# Patient Record
Sex: Female | Born: 1965 | Race: White | Hispanic: No | Marital: Single | Smoking: Current every day smoker
Health system: Southern US, Community
[De-identification: ages and names within clinical notes are randomized; demographics above are authoritative.]

## PROBLEM LIST (undated history)

## (undated) DIAGNOSIS — I82409 Acute embolism and thrombosis of unspecified deep veins of unspecified lower extremity: Secondary | ICD-10-CM

## (undated) DIAGNOSIS — Z86711 Personal history of pulmonary embolism: Secondary | ICD-10-CM

## (undated) DIAGNOSIS — M93272 Osteochondritis dissecans, left ankle and joints of left foot: Secondary | ICD-10-CM

## (undated) DIAGNOSIS — Z8719 Personal history of other diseases of the digestive system: Secondary | ICD-10-CM

## (undated) DIAGNOSIS — M19072 Primary osteoarthritis, left ankle and foot: Secondary | ICD-10-CM

## (undated) DIAGNOSIS — R000 Tachycardia, unspecified: Secondary | ICD-10-CM

## (undated) DIAGNOSIS — F419 Anxiety disorder, unspecified: Secondary | ICD-10-CM

## (undated) DIAGNOSIS — G5602 Carpal tunnel syndrome, left upper limb: Secondary | ICD-10-CM

## (undated) DIAGNOSIS — D689 Coagulation defect, unspecified: Secondary | ICD-10-CM

## (undated) DIAGNOSIS — Z860100 Personal history of colon polyps, unspecified: Secondary | ICD-10-CM

## (undated) DIAGNOSIS — I2699 Other pulmonary embolism without acute cor pulmonale: Secondary | ICD-10-CM

## (undated) DIAGNOSIS — F32A Depression, unspecified: Secondary | ICD-10-CM

## (undated) DIAGNOSIS — I4891 Unspecified atrial fibrillation: Secondary | ICD-10-CM

## (undated) DIAGNOSIS — Z7901 Long term (current) use of anticoagulants: Secondary | ICD-10-CM

## (undated) DIAGNOSIS — I10 Essential (primary) hypertension: Secondary | ICD-10-CM

## (undated) DIAGNOSIS — E785 Hyperlipidemia, unspecified: Secondary | ICD-10-CM

## (undated) DIAGNOSIS — K449 Diaphragmatic hernia without obstruction or gangrene: Secondary | ICD-10-CM

## (undated) DIAGNOSIS — F329 Major depressive disorder, single episode, unspecified: Secondary | ICD-10-CM

## (undated) DIAGNOSIS — E538 Deficiency of other specified B group vitamins: Secondary | ICD-10-CM

## (undated) DIAGNOSIS — M87072 Idiopathic aseptic necrosis of left ankle: Secondary | ICD-10-CM

## (undated) DIAGNOSIS — M797 Fibromyalgia: Secondary | ICD-10-CM

## (undated) HISTORY — DX: Primary osteoarthritis, left ankle and foot: M19.072

## (undated) HISTORY — DX: Idiopathic aseptic necrosis of left ankle: M87.072

## (undated) HISTORY — PX: ANKLE FUSION: SHX881

## (undated) HISTORY — PX: ABDOMINAL HYSTERECTOMY: SHX81

## (undated) HISTORY — DX: Personal history of pulmonary embolism: Z86.711

## (undated) HISTORY — DX: Personal history of colonic polyps: Z86.010

## (undated) HISTORY — DX: Personal history of other diseases of the digestive system: Z87.19

## (undated) HISTORY — DX: Diaphragmatic hernia without obstruction or gangrene: K44.9

## (undated) HISTORY — DX: Carpal tunnel syndrome, left upper limb: G56.02

## (undated) HISTORY — DX: Long term (current) use of anticoagulants: Z79.01

## (undated) HISTORY — DX: Osteochondritis dissecans, left ankle and joints of left foot: M93.272

## (undated) HISTORY — DX: Coagulation defect, unspecified: D68.9

## (undated) HISTORY — DX: Hyperlipidemia, unspecified: E78.5

## (undated) HISTORY — PX: SHOULDER ARTHROSCOPY: SHX128

## (undated) HISTORY — DX: Unspecified atrial fibrillation: I48.91

## (undated) HISTORY — DX: Deficiency of other specified B group vitamins: E53.8

## (undated) HISTORY — DX: Personal history of colon polyps, unspecified: Z86.0100

---

## 1999-02-26 ENCOUNTER — Emergency Department (HOSPITAL_COMMUNITY): Admission: EM | Admit: 1999-02-26 | Discharge: 1999-02-26 | Payer: Self-pay | Admitting: Emergency Medicine

## 2003-03-02 ENCOUNTER — Emergency Department (HOSPITAL_COMMUNITY): Admission: AD | Admit: 2003-03-02 | Discharge: 2003-03-03 | Payer: Self-pay | Admitting: Emergency Medicine

## 2003-03-09 ENCOUNTER — Other Ambulatory Visit: Admission: RE | Admit: 2003-03-09 | Discharge: 2003-03-09 | Payer: Self-pay | Admitting: Obstetrics and Gynecology

## 2003-03-15 ENCOUNTER — Observation Stay (HOSPITAL_COMMUNITY): Admission: RE | Admit: 2003-03-15 | Discharge: 2003-03-16 | Payer: Self-pay | Admitting: Obstetrics and Gynecology

## 2003-03-15 ENCOUNTER — Encounter (INDEPENDENT_AMBULATORY_CARE_PROVIDER_SITE_OTHER): Payer: Self-pay | Admitting: Specialist

## 2003-06-10 ENCOUNTER — Inpatient Hospital Stay (HOSPITAL_COMMUNITY): Admission: RE | Admit: 2003-06-10 | Discharge: 2003-06-11 | Payer: Self-pay | Admitting: Obstetrics and Gynecology

## 2003-06-10 ENCOUNTER — Encounter (INDEPENDENT_AMBULATORY_CARE_PROVIDER_SITE_OTHER): Payer: Self-pay | Admitting: Specialist

## 2004-06-23 ENCOUNTER — Ambulatory Visit (HOSPITAL_COMMUNITY): Admission: RE | Admit: 2004-06-23 | Discharge: 2004-06-23 | Payer: Self-pay | Admitting: Gastroenterology

## 2004-07-16 ENCOUNTER — Inpatient Hospital Stay (HOSPITAL_COMMUNITY): Admission: EM | Admit: 2004-07-16 | Discharge: 2004-07-21 | Payer: Self-pay | Admitting: Emergency Medicine

## 2005-03-11 ENCOUNTER — Emergency Department (HOSPITAL_COMMUNITY): Admission: EM | Admit: 2005-03-11 | Discharge: 2005-03-12 | Payer: Self-pay | Admitting: Emergency Medicine

## 2005-03-20 ENCOUNTER — Ambulatory Visit: Payer: Self-pay | Admitting: Hematology & Oncology

## 2005-04-17 ENCOUNTER — Ambulatory Visit (HOSPITAL_COMMUNITY): Admission: RE | Admit: 2005-04-17 | Discharge: 2005-04-17 | Payer: Self-pay | Admitting: Hematology & Oncology

## 2005-05-16 ENCOUNTER — Ambulatory Visit: Payer: Self-pay | Admitting: Hematology & Oncology

## 2006-05-15 ENCOUNTER — Emergency Department (HOSPITAL_COMMUNITY): Admission: EM | Admit: 2006-05-15 | Discharge: 2006-05-15 | Payer: Self-pay | Admitting: *Deleted

## 2006-05-21 ENCOUNTER — Emergency Department (HOSPITAL_COMMUNITY): Admission: EM | Admit: 2006-05-21 | Discharge: 2006-05-21 | Payer: Self-pay | Admitting: Emergency Medicine

## 2009-06-16 ENCOUNTER — Encounter (INDEPENDENT_AMBULATORY_CARE_PROVIDER_SITE_OTHER): Payer: Self-pay | Admitting: Emergency Medicine

## 2009-06-16 ENCOUNTER — Emergency Department (HOSPITAL_COMMUNITY): Admission: EM | Admit: 2009-06-16 | Discharge: 2009-06-16 | Payer: Self-pay | Admitting: Emergency Medicine

## 2009-06-16 ENCOUNTER — Ambulatory Visit: Payer: Self-pay | Admitting: Vascular Surgery

## 2010-06-25 LAB — PROTIME-INR: Prothrombin Time: 22.5 seconds — ABNORMAL HIGH (ref 11.6–15.2)

## 2010-08-18 NOTE — Op Note (Signed)
NAME:  Tara Gonzales, Tara Gonzales NO.:  [id number]   MEDICAL RECORD NO.:  [id number]                   PATIENT TYPE:  AMB   LOCATION:  DAY                                  FACILITY:  [facility]   PHYSICIAN:  Miguel Aschoff, M.D.                    DATE OF BIRTH:  18-Mar-1966   DATE OF PROCEDURE:  03/15/2003  DATE OF DISCHARGE:                                 OPERATIVE REPORT   PREOPERATIVE DIAGNOSES:  Complex left adnexal mass, with left lower quadrant  pain.   POSTOPERATIVE DIAGNOSES:  Complex left adnexal mass, with left lower  quadrant pain.   PROCEDURE:  Laparoscopic left salpingo-oophorectomy.   SURGEON:  Miguel Aschoff, M.D.   ASSISTANT:  Luvenia Redden, M.D.   ANESTHESIA:  General.   COMPLICATIONS:  None.   JUSTIFICATION:  The patient is a 45 year old white female with a several-  week history of left lower quadrant pain.  Initially noted to have a 3.0 cm  cystic mass in the left lower quadrant, and on most recent examination this  mass has grown to 7.0 cm, associated with increased pain.  Because of the  progression in the size of the left ovarian mass with the pelvic and lower  abdominal pain, she presents now to undergo a laparoscopy and a laparotomy  if necessary, to seek an etiology for this mass to be established and  corrected.  The risks and benefits of this procedure were discussed with the  patient.   DESCRIPTION OF PROCEDURE:  The patient was taken to the operating room and  placed in the supine position.  A general anesthesia was administered  without difficulty.  She was then placed in the dorsal lithotomy position  and prepped and draped in the usual sterile fashion.  A catheter was placed  in the bladder, and the Hulka tenaculum was placed through the cervix.  At  this point a small infraumbilical incision was made.  A Veress needle was  inserted and the abdomen was insufflated with 3 liters of CO2.  Following  the insufflation, the trocar and  laparoscope was placed __________ the  laparoscope itself.  To allow Tara Gonzales visualization, a second 5 mm suprapubic  port was established under direct visualization.  At this point the bladder  peritoneum was noted to be unremarkable.  The round ligaments were  unremarkable.  The right tube was visualized and very consistent with prior  tubal sterilization.  The right ovary was within normal limits.  The right  tube was normal.  On the left side there was a large ovarian mass  approximately 7.0 cm x 5.0 cm in size.  There were no excrescences noted on  the surfaces.  The mass was free and not adherent to any peritoneal surface.  The left tube showed evidence of prior sterilization.  The intestinal  surfaces were otherwise unremarkable.  The liver was inspected and was noted  to be within normal limits.  At this point a 12 mm left lower quadrant port  was established under direct visualization.  Then using the tripolar cautery  forceps, it was possible to cauterize across the utero-ovarian ligament and  the residual portion of the fallopian tube across the meso-ovarian ligament,  until the infundibular pelvic ligament was reached, and the infundibular  pelvic ligament was cauterized.  The specimen was freed.  Inspection made  for hemostasis at the excision site.  The hemostasis appeared to be  excellent.  At this point an EndoCatch bag was placed into the abdomen and  the specimen was placed into the EndoCatch bag and brought up to the  abdominal surface.  Then the cystic mass was punctured.  The cyst fluid was  drained, with care to avoid any spill into the abdominal cavity, and then it  was possible to bring the cystic mass up through the 12 mm port.  Once this  was done, a final inspection is made.  The hemostasis again appeared  excellent.  At this point all instruments are removed.  The procedure was  completed.  The 12 mm left lower quadrant port site was closed by placing  three  sutures of #0 Vicryl through the fascia.  The subcutaneous tissue and  the skin were closed using subcuticular #4-0 Vicryl.  The residual other  incisions were closed using subcuticular #4-0 Vicryl.  The estimated blood  loss was less than 20 mL.  The patient tolerated the procedure well.   PLAN:  For the patient to be observed overnight, and to be discharged home  on March 16, 2003, if stable.  The final pathology is a cystic ovarian  mass.                                               Miguel Aschoff, M.D.    AR/MEDQ  D:  03/15/2003  T:  03/15/2003  Job:  [id number]

## 2010-08-18 NOTE — Discharge Summary (Signed)
NAMEBETHENY, Tara Gonzales                  ACCOUNT NO.:  [id number]   MEDICAL RECORD NO.:  [id number]          PATIENT TYPE:  INP   LOCATION:  6713                         FACILITY:  [facility]   PHYSICIAN:  Leighton Roach McDiarmid, M.D.DATE OF BIRTH:  [birth year]   DATE OF ADMISSION:  07/16/2004  DATE OF DISCHARGE:  07/21/2004                                 DISCHARGE SUMMARY   DISCHARGE DIAGNOSES:  1.  Pulmonary embolism in branch of right lower lobe pulmonary artery.  2.  Anxiety/panic attacks.  3.  Esophagitis/hiatal hernia.   DISCHARGE MEDICATIONS:  1.  Lexapro (20 mg) 1 tab p.o. at bedtime.  2.  Nexium (40 mg) 1 tab p.o. daily.  3.  Xanax XR 1 mg 1 tab p.o. daily.  4.  Coumadin (dose per pharmacy).   BRIEF HISTORY AND HOSPITAL COURSE:   CHIEF COMPLAINT:  Left shoulder/left chest pain.   HISTORY OF PRESENT ILLNESS:  A 45 year old white female with history of  anxiety, esophagitis, with two-day history of chest pain, left sided, left  shoulder pain, SOB.  The chest pain was located on the left side throughout  all of the anterior area of the left chest and also subscapular.  Pain  increased with deep inspiration.  Hemoptysis and cough on July 16, 2004.  The patient denied sweating, fever, nausea, or vomiting.  Positive for  palpitations.  Denying surgery, injury, calf pain, family history of  bleeding disorders, recent travel.  The patient smokes 3/4 of a pack a day.  Last OCPs in 1992.   PERTINENT LABORATORY DATA ON ADMISSION:  D-dimer 2.5.  Chest x-ray showed  left lower lobe infiltrates with effusion.  A small right lower lobe  infiltrate, pneumonia versus PE.  Angio CT showed embolus in branch of right  lower lobe posterior segment pulmonary artery, moderate left lower lobe  atelectasis, with a small left effusion.  Artifact limiting evaluation to  rule out PE in left lower lobe.   1.  Pulmonary embolism.  The patient denies OCPs, surgery, immobilization,      pregnancy  (the patient  had a hysterectomy performed in February 2005;      there is therefore no pregnancy test needed), trauma, previous      thromboembolism events.  The patient had no family history of      hypercoagulable states, hematologic disorders.  PE most likely to be      idiopathic.  Morphine was given for pain.  The patient was placed on      heparin and Coumadin.  Oxygen was indicated to keep O2 saturations      greater than or equal to 92%.  Throughout this hospitalization, the      patient showed remarkable clinical improvement.  Pleuritic plain      decreased remarkably.  O2 saturations were stable (100% on room air).      Pharmacy dosing Coumadin/heparin.  The patient was clinically stable but      remained at the hospital until INR levels were stable.  An attempt at      thoracentesis was made  by interventional radiology.  A thorax ultrasound      showed very small quantity of fluids.  Therefore, thoracentesis was      unable to be performed.  PE of unclear etiology.  Breast exam and      mammogram to be scheduled as an outpatient to rule out breast cancer.      This patient, given this unclear etiology for PE, will be treated for      one year with Coumadin.  INR and PT levels will be followed up as an      outpatient at [facility] in [facility], where this patient has a primary      physician.  The patient was informed that smoking places her at risk of      another episode of PE.  The patient received a consulting during this      hospitalization for smoking cessation, and this issue is to be followed      as an outpatient.  2.  Anxiety/panic attacks/esophagitis.  Stable throughout the days of      hospitalization.  The patient was placed on home regimen of medications      for these issues.   PROCEDURE:  CT angio of chest.   INSTRUCTIONS:  The patient was informed to avoid alcohol, a less amount of  broccoli, Brussels sprouts,  cabbage, cauliflower, green teas, lettuce,  spinach, and  watercress (this may affect the efficacy of the Coumadin).  The  patient was also informed to call M.D. if fever, vomiting, diarrhea,  prolonged bleeding from cuts, nosebleeds, unusual bleeding from gums from  brushing teeth, red or dark brown urine, red or tarry black stools, unusual  bruising for unknown reasons.  Call also M.D. if serious fall or trauma.   FOLLOWUP:  At [facility] with Dr. Perlie Gold.  Also, follow up at  [facility] as an outpatient INR levels and PT level for Coumadin  dose adjustment.      FIM/MEDQ  D:  07/20/2004  T:  07/21/2004  Job:  [id number]   cc:   Perlie Gold, M.D.  [facility]  [place]  [place]

## 2010-08-18 NOTE — Discharge Summary (Signed)
NAME:  Tara Gonzales, Tara Gonzales                            ACCOUNT NO.:  [id number]   MEDICAL RECORD NO.:  [id number]                   PATIENT TYPE:  INP   LOCATION:  9320                                 FACILITY:  [facility]   PHYSICIAN:  Miguel Aschoff, M.D.                    DATE OF BIRTH:  Jun 12, 1965   DATE OF ADMISSION:  06/10/2003  DATE OF DISCHARGE:                                 DISCHARGE SUMMARY   ADMISSION DIAGNOSIS:  Chronic pelvic pain, dysmenorrhea, dyspareunia.   FINAL DIAGNOSIS:  Chronic pelvic pain, dysmenorrhea, dyspareunia.   OPERATIONS AND PROCEDURES:  Total vaginal hysterectomy, general anesthesia.   BRIEF HISTORY:  The patient is a 45 year old white female with long history  of pelvic pain, increasing dysmenorrhea, and dyspareunia.  The patient  underwent laparoscopic left salpingo-oophorectomy for a complex left adnexal  mass in December 2004 which was felt to be partially responsible for the  patient's pain.  However, in spite of removal of this adnexal mass the pain  has persisted and increased in nature.  Because of her symptomatology,  desire for definitive therapy, she was admitted to the hospital to undergo  total vaginal hysterectomy.  At the time of laparoscopy in December there  were no other abnormalities noted in the pelvis other than the left adnexal  mass.   HOSPITAL COURSE:  Preoperative studies were obtained.  This revealed  admission hemoglobin of 13.4, hematocrit 40.3, white count was 8300.  Chemistry profile was within normal limits.  PT and PTT were within normal  limits.  Under general anesthesia on June 10, 2003 a total vaginal  hysterectomy was carried out without difficulty.  Postoperative day #1 the  patient was feeling remarkably well, reporting that her postoperative pain  was less than her pelvic pain prior to the surgery.  She was tolerating a  regular diet, ambulating well, and was able to be discharged home.  Her  hemoglobin on discharged was 10.7.   She was sent home on Percocet one q.3h.  as needed for pain.  Instructed to place nothing in the vagina; to call if  there were any problems such as fever, pain, or heavy bleeding; and to make  a follow-up appointment in the office in 4 weeks for a postoperative check.   FINAL DIAGNOSIS:  Chronic pelvic pain, dysmenorrhea, dyspareunia.  Final  pathology report is pending.                                               Miguel Aschoff, M.D.    AR/MEDQ  D:  06/11/2003  T:  06/11/2003  Job:  [id number]

## 2010-08-18 NOTE — Op Note (Signed)
NAME:  Tara Gonzales, Tara Gonzales                            ACCOUNT NO.:  [id number]   MEDICAL RECORD NO.:  [id number]                   PATIENT TYPE:  INP   LOCATION:  9320                                 FACILITY:  [facility]   PHYSICIAN:  Miguel Aschoff, M.D.                    DATE OF BIRTH:  [birth year]   DATE OF PROCEDURE:  06/10/2003  DATE OF DISCHARGE:                                 OPERATIVE REPORT   PREOPERATIVE DIAGNOSES:  1. Chronic pelvic pain.  2. Dysmenorrhea.  3. Dyspareunia.  4. Cervical intraepithelial neoplasia.   POSTOPERATIVE DIAGNOSES:  1. Chronic pelvic pain.  2. Dysmenorrhea.  3. Dyspareunia.  4. Cervical intraepithelial neoplasia.   PROCEDURE:  Total vaginal hysterectomy.   SURGEON:  Miguel Aschoff, M.D.   ASSISTANT:  Luvenia Redden, M.D.   ANESTHESIA:  General.   COMPLICATIONS:  None.   JUSTIFICATION:  The patient is a 45 year old white female with history of  persistent pelvic pain.  The patient was previously evaluated  laparoscopically in December 2004 for a large left adnexal mass and  underwent a left salpingo-oophorectomy via the laparoscope.  While this did  remove the mass, her pain has persisted and now is at a point where she  wants definitive therapy carried out.  She now presents for total vaginal  hysterectomy.  The patient has a history of cervical intraepithelial  neoplasia.   DESCRIPTION OF PROCEDURE:  The patient was taken to the operating room,  placed in a supine position, and general anesthesia was administered without  difficulty.  She was then placed in the dorsal lithotomy position and  prepped and draped in the usual sterile fashion.  The bladder was  catheterized.  A speculum was placed in the vaginal vault, the anterior  cervical lip was grasped with a tenaculum, and then the cervix was injected  with 1% Xylocaine with epinephrine for hemostasis.  A circumferential  incision was then made about the cervical mucosa with care to take a wide  margin because of the history of cervical intraepithelial neoplasia.  Dissection was then carried out anteriorly and posteriorly until the  peritoneal reflections were found and entered without difficulty.  At this  point the uterosacral ligaments were identified, clamped, cut, and suture  ligated using Heaney clamps and suture ligatures of 0 Vicryl.  The cardinal  ligaments were then clamped, cut, and suture ligated in similar fashion.  Additional bites were taken of the paracervical fascia using curved Heaney  clamps.  All pedicles were cut and suture ligated using suture ligatures of  0 Vicryl.  The uterine vessels were then found, clamped, cut, and suture  ligated in a similar fashion.  Additional bites were taken of the broad  ligament structures until it was possible to reach the utero-ovarian  ligaments.  At this point the fundus was delivered through the cul-de-sac  and the remaining  structures on the right side, which included the round  ligament, fallopian tube, and utero-ovarian ligament, were clamped with a  Heaney clamp.  This pedicle was cut on the left side since the patient's  tube and ovary were removed.  This additional pedicle was grasped with a  Heaney clamp and cut and the specimen was excised.  These pedicles were then  doubly ligated, first with suture ligatures of #1 Vicryl and then with free  ties of #1 Vicryl.  These pedicles were then held.  At this point inspection  was made for hemostasis.  Hemostasis appeared to be excellent.  In an effort  to prevent any formation of enterocele, the cul-de-sac was reduced using a  suture of 0 Vicryl, then the posterior vaginal cuff was run using running  interlocking 0 Vicryl suture.  With good hemostasis present, with instrument  and lap counts taken and found to be correct, the peritoneum was then closed  using a pursestring suture of 0 Vicryl and once this was done, the vaginal  mucosa was reapproximated using running  continuous 2-0 Vicryl suture.  The  uterosacral ligaments were ligated for support using a figure-of-eight  suture of 0 Vicryl.  A Foley catheter was inserted, clear urine was  obtained.  A vaginal pack of two-inch iodoform gauze was then placed, and  the procedure was completed.  The patient was taken to the recovery room in  satisfactory condition.  The estimated blood loss was less than 50 mL.                                               Miguel Aschoff, M.D.    AR/MEDQ  D:  06/10/2003  T:  06/11/2003  Job:  [id number]

## 2011-01-22 ENCOUNTER — Emergency Department (HOSPITAL_COMMUNITY)
Admission: EM | Admit: 2011-01-22 | Discharge: 2011-01-23 | Disposition: A | Discharge status: Other Acute Inpatient Hospital | Payer: Self-pay | Attending: Emergency Medicine | Admitting: Emergency Medicine

## 2011-01-22 DIAGNOSIS — Y92009 Unspecified place in unspecified non-institutional (private) residence as the place of occurrence of the external cause: Secondary | ICD-10-CM | POA: Insufficient documentation

## 2011-01-22 DIAGNOSIS — X789XXA Intentional self-harm by unspecified sharp object, initial encounter: Secondary | ICD-10-CM | POA: Insufficient documentation

## 2011-01-22 DIAGNOSIS — S61509A Unspecified open wound of unspecified wrist, initial encounter: Secondary | ICD-10-CM | POA: Insufficient documentation

## 2011-01-22 DIAGNOSIS — Z79899 Other long term (current) drug therapy: Secondary | ICD-10-CM | POA: Insufficient documentation

## 2011-01-22 DIAGNOSIS — F17200 Nicotine dependence, unspecified, uncomplicated: Secondary | ICD-10-CM | POA: Insufficient documentation

## 2011-01-22 DIAGNOSIS — Z7901 Long term (current) use of anticoagulants: Secondary | ICD-10-CM | POA: Insufficient documentation

## 2011-01-22 DIAGNOSIS — K219 Gastro-esophageal reflux disease without esophagitis: Secondary | ICD-10-CM | POA: Insufficient documentation

## 2011-01-22 DIAGNOSIS — F329 Major depressive disorder, single episode, unspecified: Secondary | ICD-10-CM | POA: Insufficient documentation

## 2011-01-22 DIAGNOSIS — D6859 Other primary thrombophilia: Secondary | ICD-10-CM | POA: Insufficient documentation

## 2011-01-22 DIAGNOSIS — E785 Hyperlipidemia, unspecified: Secondary | ICD-10-CM | POA: Insufficient documentation

## 2011-01-22 DIAGNOSIS — F3289 Other specified depressive episodes: Secondary | ICD-10-CM | POA: Insufficient documentation

## 2011-01-22 LAB — RAPID URINE DRUG SCREEN, HOSP PERFORMED
Amphetamines: NOT DETECTED
Barbiturates: NOT DETECTED
Benzodiazepines: POSITIVE — AB
Cocaine: NOT DETECTED
Opiates: POSITIVE — AB
Tetrahydrocannabinol: NOT DETECTED

## 2011-01-22 LAB — CBC
HCT: 33.9 % — ABNORMAL LOW (ref 36.0–46.0)
HCT: 33.1 % — ABNORMAL LOW (ref 36.0–46.0)
Hemoglobin: 11.2 g/dL — ABNORMAL LOW (ref 12.0–15.0)
Hemoglobin: 11 g/dL — ABNORMAL LOW (ref 12.0–15.0)
MCH: 29.6 pg (ref 26.0–34.0)
MCHC: 33 g/dL (ref 30.0–36.0)
MCV: 88.7 fL (ref 78.0–100.0)
RBC: 3.79 MIL/uL — ABNORMAL LOW (ref 3.87–5.11)
RBC: 3.73 MIL/uL — ABNORMAL LOW (ref 3.87–5.11)
RDW: 14.1 % (ref 11.5–15.5)
WBC: 14.6 10*3/uL — ABNORMAL HIGH (ref 4.0–10.5)

## 2011-01-22 LAB — DIFFERENTIAL
Basophils Absolute: 0 10*3/uL (ref 0.0–0.1)
Basophils Absolute: 0 10*3/uL (ref 0.0–0.1)
Basophils Relative: 0 % (ref 0–1)
Eosinophils Absolute: 0.2 10*3/uL (ref 0.0–0.7)
Eosinophils Relative: 2 % (ref 0–5)
Eosinophils Relative: 1 % (ref 0–5)
Lymphocytes Relative: 22 % (ref 12–46)
Lymphocytes Relative: 18 % (ref 12–46)
Lymphs Abs: 2.1 10*3/uL (ref 0.7–4.0)
Lymphs Abs: 2.6 10*3/uL (ref 0.7–4.0)
Monocytes Absolute: 0.7 10*3/uL (ref 0.1–1.0)
Monocytes Relative: 7 % (ref 3–12)
Neutro Abs: 6.4 10*3/uL (ref 1.7–7.7)
Neutro Abs: 10.7 10*3/uL — ABNORMAL HIGH (ref 1.7–7.7)
Neutrophils Relative %: 68 % (ref 43–77)

## 2011-01-22 LAB — BASIC METABOLIC PANEL
CO2: 26 mEq/L (ref 19–32)
Calcium: 9.4 mg/dL (ref 8.4–10.5)
Chloride: 102 mEq/L (ref 96–112)
Glucose, Bld: 92 mg/dL (ref 70–99)
Sodium: 136 mEq/L (ref 135–145)

## 2011-01-22 LAB — PROTIME-INR
INR: 3.28 — ABNORMAL HIGH (ref 0.00–1.49)
Prothrombin Time: 33.9 seconds — ABNORMAL HIGH (ref 11.6–15.2)

## 2011-01-22 LAB — PREGNANCY, URINE: Preg Test, Ur: NEGATIVE

## 2011-01-23 ENCOUNTER — Inpatient Hospital Stay (HOSPITAL_COMMUNITY)
Admission: AD | Admit: 2011-01-23 | Discharge: 2011-01-25 | DRG: 885 | Disposition: A | Discharge status: Home / Self Care | Payer: PRIVATE HEALTH INSURANCE | Attending: Psychiatry | Admitting: Psychiatry

## 2011-01-23 DIAGNOSIS — Z79899 Other long term (current) drug therapy: Secondary | ICD-10-CM

## 2011-01-23 DIAGNOSIS — Z560 Unemployment, unspecified: Secondary | ICD-10-CM

## 2011-01-23 DIAGNOSIS — F39 Unspecified mood [affective] disorder: Secondary | ICD-10-CM

## 2011-01-23 DIAGNOSIS — X789XXA Intentional self-harm by unspecified sharp object, initial encounter: Secondary | ICD-10-CM

## 2011-01-23 DIAGNOSIS — Z7901 Long term (current) use of anticoagulants: Secondary | ICD-10-CM

## 2011-01-23 DIAGNOSIS — E785 Hyperlipidemia, unspecified: Secondary | ICD-10-CM

## 2011-01-23 DIAGNOSIS — F1120 Opioid dependence, uncomplicated: Secondary | ICD-10-CM

## 2011-01-23 DIAGNOSIS — K219 Gastro-esophageal reflux disease without esophagitis: Secondary | ICD-10-CM

## 2011-01-23 DIAGNOSIS — G8929 Other chronic pain: Secondary | ICD-10-CM

## 2011-01-23 DIAGNOSIS — F4310 Post-traumatic stress disorder, unspecified: Secondary | ICD-10-CM

## 2011-01-23 DIAGNOSIS — F329 Major depressive disorder, single episode, unspecified: Principal | ICD-10-CM

## 2011-01-23 DIAGNOSIS — D682 Hereditary deficiency of other clotting factors: Secondary | ICD-10-CM

## 2011-01-23 DIAGNOSIS — IMO0002 Reserved for concepts with insufficient information to code with codable children: Secondary | ICD-10-CM

## 2011-01-23 DIAGNOSIS — S61509A Unspecified open wound of unspecified wrist, initial encounter: Secondary | ICD-10-CM

## 2011-01-23 DIAGNOSIS — F1320 Sedative, hypnotic or anxiolytic dependence, uncomplicated: Secondary | ICD-10-CM

## 2011-01-23 DIAGNOSIS — Z86711 Personal history of pulmonary embolism: Secondary | ICD-10-CM

## 2011-01-23 DIAGNOSIS — M79609 Pain in unspecified limb: Secondary | ICD-10-CM

## 2011-01-23 DIAGNOSIS — F0781 Postconcussional syndrome: Secondary | ICD-10-CM

## 2011-01-24 LAB — PROTIME-INR: INR: 1.18 (ref 0.00–1.49)

## 2011-01-25 LAB — PROTIME-INR
INR: 1.61 — ABNORMAL HIGH (ref 0.00–1.49)
Prothrombin Time: 19.4 seconds — ABNORMAL HIGH (ref 11.6–15.2)

## 2011-01-25 NOTE — Assessment & Plan Note (Signed)
Tara Gonzales, Tara Gonzales NO.:  [id number]  MEDICAL RECORD NO.:  [id number]  LOCATION:  0305                          FACILITY:  [facility]  PHYSICIAN:  Orson Aloe, MD       DATE OF BIRTH:  [birth year]  DATE OF ADMISSION:  01/23/2011 DATE OF DISCHARGE:                      PSYCHIATRIC ADMISSION ASSESSMENT   IDENTIFYING INFORMATION:  This is a 45 year old female who was admitted on January 22, 2011.  HISTORY OF PRESENT ILLNESS:  The patient presents to the emergency department after self-inflicted injuries cutting both her right and left wrist with a razor blade.  She states she has just tired of dealing with her physical pain, describing pains in her joints and muscles.  She states that she has discussed this with her primary care provider who feels it might be fibromyalgia.  They have been trying other medications that are ineffective.  She denies any hallucinations, denies any substance use.  PAST PSYCHIATRIC HISTORY:  First admission to [facility]. No current outpatient mental health therapy.  SOCIAL HISTORY:  Patient is married.  She has a 78 year old daughter. She resides in [place].  She is unemployed.  FAMILY HISTORY:  None.  ALCOHOL AND DRUG HISTORY:  She denies any substance use.  PRIMARY CARE PROVIDER:  Dr. Debroah Loop.  MEDICAL PROBLEMS: 1. History of GERD. 2. Hyperlipidemia. 3. 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  MEDICATIONS: 1. Coumadin 3 mg daily. 2. Calcium carbonate 600 mg/400 mg, 1 daily. 3. Vitamin C, 2 tablets daily. 4. Requip 2 mg, 1 at bedtime. 5. Triplex 1 daily. 6. Atenolol 100 mg b.i.d. 7. Alprazolam 1 mg b.i.d. 8. Pristiq XR 50 mg 1 daily.  DRUG ALLERGIES:  No known allergies.  PHYSICAL EXAMINATION:  This is a middle-aged female.  She was assessed in the emergency department.  Physical exam was reviewed.  The patient does have dressings to both her wrists that are dry and intact.  She  is complaining of some overall pain but does not appear in any acute distress.  Her INR is 3.28.  Her white count is elevated at 14.6, hemoglobin of 11, hematocrit 32.1.  Alcohol level less than 11.  Her B- Met is within normal limits.  Her urine drug screen is positive for opiates and positive for benzodiazepines.  Urine pregnancy test is negative.  Patient's lacerations required suturing.  The patient also received a tetanus injection.  MENTAL STATUS EXAMINATION:  The patient is fully alert and cooperative in the day room, eating a snack.  She is dressed in scrubs.  Again, she has 2 dressings to her wrists that are dry and intact.  Thought processes are coherent, goal directed.  She promises safety.  Denies any homicidal thoughts.  Denies any psychotic symptoms.  DIAGNOSES:  Axis I:  Major depressive disorder, single episode. Axis II:  Deferred. Axis III:  History of GERD, hyperlipidemia, chronic pain and clotting disorder. Axis IV:  Medical problems. Axis V:  Current is 35.  PLAN:  Add Tegretol and Thorazine for her anxiety.  We will have pharmacy monitor her PT/INR for Coumadin dosing.  We will monitor signs and symptoms of infection on her wrist.  We  will have contact with her support group.  Her tentative length of stay at this time is 3-5 days.     Landry Corporal, N.P.   ______________________________ Orson Aloe, MD    JO/MEDQ  D:  01/23/2011  T:  01/23/2011  Job:  [id number]  Electronically Signed by Limmie PatriciaP. on 01/24/2011 10:34:34 AM Electronically Signed by Orson Aloe  on 01/25/2011 12:57:41 PM
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
## 2011-11-10 ENCOUNTER — Emergency Department (HOSPITAL_COMMUNITY)
Admission: EM | Admit: 2011-11-10 | Discharge: 2011-11-10 | Disposition: A | Discharge status: Home / Self Care | Payer: Self-pay | Attending: Emergency Medicine | Admitting: Emergency Medicine

## 2011-11-10 ENCOUNTER — Encounter (HOSPITAL_COMMUNITY): Payer: Self-pay | Admitting: Emergency Medicine

## 2011-11-10 DIAGNOSIS — M79606 Pain in leg, unspecified: Secondary | ICD-10-CM

## 2011-11-10 DIAGNOSIS — M79609 Pain in unspecified limb: Secondary | ICD-10-CM

## 2011-11-10 DIAGNOSIS — I10 Essential (primary) hypertension: Secondary | ICD-10-CM | POA: Insufficient documentation

## 2011-11-10 DIAGNOSIS — Z7901 Long term (current) use of anticoagulants: Secondary | ICD-10-CM | POA: Insufficient documentation

## 2011-11-10 DIAGNOSIS — F341 Dysthymic disorder: Secondary | ICD-10-CM | POA: Insufficient documentation

## 2011-11-10 DIAGNOSIS — Z86718 Personal history of other venous thrombosis and embolism: Secondary | ICD-10-CM | POA: Insufficient documentation

## 2011-11-10 DIAGNOSIS — IMO0001 Reserved for inherently not codable concepts without codable children: Secondary | ICD-10-CM | POA: Insufficient documentation

## 2011-11-10 HISTORY — DX: Anxiety disorder, unspecified: F41.9

## 2011-11-10 HISTORY — DX: Depression, unspecified: F32.A

## 2011-11-10 HISTORY — DX: Other pulmonary embolism without acute cor pulmonale: I26.99

## 2011-11-10 HISTORY — DX: Acute embolism and thrombosis of unspecified deep veins of unspecified lower extremity: I82.409

## 2011-11-10 HISTORY — DX: Fibromyalgia: M79.7

## 2011-11-10 HISTORY — DX: Major depressive disorder, single episode, unspecified: F32.9

## 2011-11-10 HISTORY — DX: Essential (primary) hypertension: I10

## 2011-11-10 LAB — PROTIME-INR
INR: 2.2 — ABNORMAL HIGH (ref 0.00–1.49)
Prothrombin Time: 24.8 seconds — ABNORMAL HIGH (ref 11.6–15.2)

## 2011-11-10 MED ORDER — HYDROMORPHONE HCL PF 1 MG/ML IJ SOLN
1.0000 mg | Freq: Once | INTRAMUSCULAR | Status: AC
  Administered 2011-11-10: 1 mg via INTRAVENOUS
  Filled 2011-11-10: qty 1

## 2011-11-10 MED ORDER — KETOROLAC TROMETHAMINE 30 MG/ML IJ SOLN
30.0000 mg | Freq: Once | INTRAMUSCULAR | Status: AC
  Administered 2011-11-10: 30 mg via INTRAVENOUS
  Filled 2011-11-10: qty 1

## 2011-11-10 NOTE — ED Provider Notes (Signed)
History    This chart was scribed for Gerhard Munch, MD, MD by Smitty Pluck. The patient was seen in room TR07C and the patient's care was started at 3:55PM.   CSN: [id number]  Arrival date & time 11/10/11  1538   None     Chief Complaint  Patient presents with  . Leg Pain    (Consider location/radiation/quality/duration/timing/severity/associated sxs/prior treatment) Patient is a 46 y.o. female presenting with leg pain. The history is provided by the patient.  Leg Pain    Tara Gonzales is a 46 y.o. female who presents to the Emergency Department complaining of acute on chronic, moderate right leg pain with symptoms worsening 1 day ago. Pt reports that she has more focal tenderness since yesterday. Pain is rated 9/10. Walking and motion aggravates the pain. Pt reports pressure at posterior right knee, right ankle and right calf. She takes hydrocodone with minor relief. Pt reports having clotting disorder. She has hx of DVT and PE. Pt takes coumadin. She thinks that there might be a clot.  She notes that she had a prior DVT, while taking her Coumadin. She denies dyspnea, chest pain, fevers, chills.  Past Medical History  Diagnosis Date  . Fibromyalgia   . Anxiety   . DVT (deep venous thrombosis)   . PE (pulmonary embolism)   . Hypertension   . Depression     History reviewed. No pertinent past surgical history.  History reviewed. No pertinent family history.  History  Substance Use Topics  . Smoking status: Not on file  . Smokeless tobacco: Not on file  . Alcohol Use:     OB History    Grav Para Term Preterm Abortions TAB SAB Ect Mult Living                  Review of Systems  Constitutional:       HPI  HENT:       HPI otherwise negative  Eyes: Negative.   Respiratory:       HPI, otherwise negative  Cardiovascular:       HPI, otherwise nmegative  Gastrointestinal: Negative for vomiting.  Genitourinary:       HPI, otherwise negative  Musculoskeletal:     HPI, otherwise negative  Skin: Negative.   Neurological: Negative for syncope.    Allergies  Review of patient's allergies indicates no known allergies.  Home Medications  No current outpatient prescriptions on file.  BP 102/67  Pulse 87  Temp 99 F (37.2 C) (Oral)  Resp 20  SpO2 94%  Physical Exam  Nursing note and vitals reviewed. Constitutional: She is oriented to person, place, and time. She appears well-developed and well-nourished.  HENT:  Head: Normocephalic and atraumatic.  Cardiovascular: Regular rhythm and normal heart sounds.  Tachycardia present.   Pulmonary/Chest: Effort normal and breath sounds normal.  Musculoskeletal:       popliteal fossa tenderness  1 cm distal thigh is larger on right compared to left thigh No superficial changes   Neurological: She is alert and oriented to person, place, and time.  Skin: Skin is warm and dry.  Psychiatric: She has a normal mood and affect. Her behavior is normal.    ED Course  Procedures (including critical care time)  COORDINATION OF CARE: 4:00PM EDP discusses pt ED treatment with pt  4:15PM EDP ordered medication:  Scheduled Meds:    .  HYDROmorphone (DILAUDID) injection  1 mg Intravenous Once  . ketorolac  30 mg Intravenous Once  Labs Reviewed  PROTIME-INR - Abnormal; Notable for the following:    Prothrombin Time 24.8 (*)     INR 2.20 (*)     All other components within normal limits   No results found.   No diagnosis found.    MDM  I personally performed the services described in this documentation, which was scribed in my presence. The recorded information has been reviewed and considered.  This female with a coagulopathy, currently on Coumadin now presents with concerns of worsening right leg pain.  Notably, the patient has tenderness to palpation and a history of prior DVT while taking her Coumadin.  The patient's evaluation today was notable for the demonstration of no DVT, and  therapeutic INR.  We discussed, at length options for further anticoagulation, further analgesia, and after the patient received relief with IV medications, she was discharged in stable condition.  Gerhard Munch, MD 11/10/11 [phone]

## 2011-11-10 NOTE — ED Notes (Signed)
Ultrasound at bedside

## 2011-11-10 NOTE — Progress Notes (Signed)
VASCULAR LAB PRELIMINARY  PRELIMINARY  PRELIMINARY  PRELIMINARY  Right lower extremity venous Doppler completed.    Preliminary report:  There is no DVT or SVT noted in the right lower extremity.  Elisha Cooksey, 11/10/2011, 4:38 PM

## 2011-11-10 NOTE — ED Notes (Signed)
Pt c/o right leg pain x 1 day; pt denies obvious injury; pt sts hx of DVT and is on coumadin

## 2012-06-20 ENCOUNTER — Emergency Department (HOSPITAL_COMMUNITY): Payer: Medicare Other

## 2012-06-20 ENCOUNTER — Emergency Department (HOSPITAL_COMMUNITY)
Admission: EM | Admit: 2012-06-20 | Discharge: 2012-06-20 | Disposition: A | Discharge status: Home / Self Care | Payer: Medicare Other | Attending: Emergency Medicine | Admitting: Emergency Medicine

## 2012-06-20 ENCOUNTER — Encounter (HOSPITAL_COMMUNITY): Payer: Self-pay

## 2012-06-20 DIAGNOSIS — F3289 Other specified depressive episodes: Secondary | ICD-10-CM | POA: Insufficient documentation

## 2012-06-20 DIAGNOSIS — Z79899 Other long term (current) drug therapy: Secondary | ICD-10-CM | POA: Insufficient documentation

## 2012-06-20 DIAGNOSIS — R3915 Urgency of urination: Secondary | ICD-10-CM | POA: Insufficient documentation

## 2012-06-20 DIAGNOSIS — Z8679 Personal history of other diseases of the circulatory system: Secondary | ICD-10-CM | POA: Insufficient documentation

## 2012-06-20 DIAGNOSIS — N12 Tubulo-interstitial nephritis, not specified as acute or chronic: Secondary | ICD-10-CM | POA: Insufficient documentation

## 2012-06-20 DIAGNOSIS — Z86711 Personal history of pulmonary embolism: Secondary | ICD-10-CM | POA: Insufficient documentation

## 2012-06-20 DIAGNOSIS — Z90710 Acquired absence of both cervix and uterus: Secondary | ICD-10-CM | POA: Insufficient documentation

## 2012-06-20 DIAGNOSIS — F329 Major depressive disorder, single episode, unspecified: Secondary | ICD-10-CM | POA: Insufficient documentation

## 2012-06-20 DIAGNOSIS — R3911 Hesitancy of micturition: Secondary | ICD-10-CM | POA: Insufficient documentation

## 2012-06-20 DIAGNOSIS — Z8739 Personal history of other diseases of the musculoskeletal system and connective tissue: Secondary | ICD-10-CM | POA: Insufficient documentation

## 2012-06-20 DIAGNOSIS — F411 Generalized anxiety disorder: Secondary | ICD-10-CM | POA: Insufficient documentation

## 2012-06-20 DIAGNOSIS — Z7901 Long term (current) use of anticoagulants: Secondary | ICD-10-CM | POA: Insufficient documentation

## 2012-06-20 HISTORY — DX: Tachycardia, unspecified: R00.0

## 2012-06-20 LAB — URINALYSIS, ROUTINE W REFLEX MICROSCOPIC
Ketones, ur: 15 mg/dL — AB
Nitrite: POSITIVE — AB
Specific Gravity, Urine: 1.012 (ref 1.005–1.030)
Urobilinogen, UA: 2 mg/dL — ABNORMAL HIGH (ref 0.0–1.0)
pH: 5 (ref 5.0–8.0)

## 2012-06-20 LAB — POCT I-STAT, CHEM 8
Calcium, Ion: 1.16 mmol/L (ref 1.12–1.23)
Chloride: 110 mEq/L (ref 96–112)
Creatinine, Ser: 0.6 mg/dL (ref 0.50–1.10)
Glucose, Bld: 99 mg/dL (ref 70–99)
HCT: 33 % — ABNORMAL LOW (ref 36.0–46.0)

## 2012-06-20 LAB — URINE MICROSCOPIC-ADD ON

## 2012-06-20 MED ORDER — SODIUM CHLORIDE 0.9 % IV SOLN
Freq: Once | INTRAVENOUS | Status: AC
  Administered 2012-06-20: 08:00:00 via INTRAVENOUS

## 2012-06-20 MED ORDER — ONDANSETRON HCL 4 MG/2ML IJ SOLN
4.0000 mg | Freq: Once | INTRAMUSCULAR | Status: AC
  Administered 2012-06-20: 4 mg via INTRAVENOUS
  Filled 2012-06-20: qty 2

## 2012-06-20 MED ORDER — HYDROMORPHONE HCL PF 1 MG/ML IJ SOLN
1.0000 mg | Freq: Once | INTRAMUSCULAR | Status: AC
  Administered 2012-06-20: 1 mg via INTRAVENOUS
  Filled 2012-06-20: qty 1

## 2012-06-20 MED ORDER — AMOXICILLIN-POT CLAVULANATE 875-125 MG PO TABS: 1.0000 | ORAL_TABLET | Freq: Two times a day (BID) | ORAL | Status: DC

## 2012-06-20 MED ORDER — DEXTROSE 5 % IV SOLN
1.0000 g | Freq: Once | INTRAVENOUS | Status: AC
  Administered 2012-06-20: 1 g via INTRAVENOUS
  Filled 2012-06-20: qty 10

## 2012-06-20 MED ORDER — OXYCODONE-ACETAMINOPHEN 5-325 MG PO TABS: 1.0000 | ORAL_TABLET | ORAL | Status: DC | PRN

## 2012-06-20 NOTE — ED Provider Notes (Signed)
History     CSN: [id number]  Arrival date & time 06/20/12  4098   First MD Initiated Contact with Patient 06/20/12 0700      Chief Complaint  Patient presents with  . Flank Pain    (Consider location/radiation/quality/duration/timing/severity/associated sxs/prior treatment) HPI Comments: Tara Gonzales is a 47 y.o. Female who complains of left flank pain for 4 hours. The pain came on suddenly and is colicky in nature. It radiates to the left lower quadrant. She has associated urinary urgency and hesitancy. She denies fever, chills, diarrhea, or constipation. She has had nausea. She has never had this previously. She has not tried any medication for that problem. There are no known modifying factors.  Patient is a 47 y.o. female presenting with flank pain. The history is provided by the patient.  Flank Pain    Past Medical History  Diagnosis Date  . Fibromyalgia   . Anxiety   . DVT (deep venous thrombosis)   . PE (pulmonary embolism)   . Depression   . Tachycardia     ongoing treatment for rate control    Past Surgical History  Procedure Laterality Date  . Abdominal hysterectomy      History reviewed. No pertinent family history.  History  Substance Use Topics  . Smoking status: Not on file  . Smokeless tobacco: Not on file  . Alcohol Use: Not on file    OB History   Grav Para Term Preterm Abortions TAB SAB Ect Mult Living                  Review of Systems  Genitourinary: Positive for flank pain.  All other systems reviewed and are negative.    Allergies  Cymbalta; Gabapentin; Lyrica; and Savella  Home Medications   Current Outpatient Rx  Name  Route  Sig  Dispense  Refill  . digoxin (LANOXIN) 0.25 MG tablet   Oral   Take 0.25 mg by mouth daily.         . Vilazodone HCl (VIIBRYD) 10 MG TABS   Oral   Take 10 mg by mouth daily.         Marland Kitchen warfarin (COUMADIN) 3 MG tablet   Oral   Take 3 mg by mouth daily.         Marland Kitchen ALPRAZolam (XANAX) 1 MG  tablet   Oral   Take 1 mg by mouth 2 (two) times daily.          Marland Kitchen amoxicillin-clavulanate (AUGMENTIN) 875-125 MG per tablet   Oral   Take 1 tablet by mouth 2 (two) times daily.   20 tablet   0   . FLUoxetine (PROZAC) 20 MG capsule   Oral   Take 20 mg by mouth 2 (two) times daily.         Marland Kitchen HYDROcodone-acetaminophen (NORCO) 7.5-325 MG per tablet   Oral   Take 1 tablet by mouth 2 (two) times daily.         . Multiple Vitamin (MULTIVITAMIN WITH MINERALS) TABS   Oral   Take 1 tablet by mouth daily.         . Omega-3 Fatty Acids (FISH OIL) 1000 MG CAPS   Oral   Take 1 capsule by mouth daily.         Marland Kitchen oxyCODONE-acetaminophen (ROXICET) 5-325 MG per tablet   Oral   Take 1 tablet by mouth every 4 (four) hours as needed for pain.   20 tablet   0   .  rotigotine (NEUPRO) 2 MG/24HR   Transdermal   Place 1 patch onto the skin daily.           BP 101/72  Pulse 90  Temp(Src) 98.7 F (37.1 C) (Oral)  Resp 17  SpO2 97%  Physical Exam  Nursing note and vitals reviewed. Constitutional: She is oriented to person, place, and time. She appears well-developed and well-nourished.  HENT:  Head: Normocephalic and atraumatic.  Eyes: Conjunctivae and EOM are normal. Pupils are equal, round, and reactive to light.  Neck: Normal range of motion and phonation normal. Neck supple.  Cardiovascular: Normal rate, regular rhythm and intact distal pulses.   Pulmonary/Chest: Effort normal and breath sounds normal. She exhibits no tenderness.  Abdominal: Soft. She exhibits no distension. There is tenderness (Mild left upper quadrant tenderness. No mass). There is no guarding.  Genitourinary:  Moderate left costovertebral angle tenderness  Musculoskeletal: Normal range of motion.  Neurological: She is alert and oriented to person, place, and time. She has normal strength. She exhibits normal muscle tone.  Skin: Skin is warm and dry.  Psychiatric: She has a normal mood and affect. Her  behavior is normal. Judgment and thought content normal.    ED Course  Procedures (including critical care time) Emergency Department treatment: IV Dilaudid and Zofran.  Imaging ordered for suspected urinary tract calculus   Reevaluation: 10:45- she had improvement of the pain but now is returning. She has continued to have urinary frequency. Additional Dilaudid ordered. Rocephin ordered for UTI  The patient states that she has never had high blood pressure. She has a history of tachycardia for which she takes  Digoxin and metoprolol.   After IV fluids. Blood pressure improved to  101/72  Nursing Notes Reviewed/ Care Coordinated Applicable Imaging Reviewed. Radiologic imaging report reviewed and images by CT  - viewed, by me. Interpretation of Laboratory Data incorporated into ED treatment   Labs Reviewed  URINALYSIS, ROUTINE W REFLEX MICROSCOPIC - Abnormal; Notable for the following:    Color, Urine RED (*)    APPearance CLOUDY (*)    Hgb urine dipstick LARGE (*)    Bilirubin Urine SMALL (*)    Ketones, ur 15 (*)    Urobilinogen, UA 2.0 (*)    Nitrite POSITIVE (*)    Leukocytes, UA SMALL (*)    All other components within normal limits  URINE MICROSCOPIC-ADD ON - Abnormal; Notable for the following:    Bacteria, UA FEW (*)    All other components within normal limits  POCT I-STAT, CHEM 8 - Abnormal; Notable for the following:    Hemoglobin 11.2 (*)    HCT 33.0 (*)    All other components within normal limits  URINE CULTURE   Ct Abdomen Pelvis Wo Contrast  06/20/2012  **ADDENDUM** CREATED: 06/20/2012 08:29:25  Right ovary with small cysts/ follicles side-by-side versus septated cyst spanning over 3.5 x 2.1 cm.  The appendix appears adjacent to this region and does not appear inflamed.  **END ADDENDUM** SIGNED BY: Almedia Balls. Constance Goltz, M.D.   06/20/2012  *RADIOLOGY REPORT*  Clinical Data: Left lower quadrant and flank pain.  Urinary frequency and dysuria.  Hypertension.  Prior  hysterectomy.  CT ABDOMEN AND PELVIS WITHOUT CONTRAST  Technique:  Multidetector CT imaging of the abdomen and pelvis was performed following the standard protocol without intravenous contrast.  Comparison: None.  Findings: Lung bases clear.  Right renal nonobstructing calculi.  Evaluation of solid abdominal viscera is limited by lack of IV contrast.  Taking this limitation into account no worrisome hepatic, splenic, pancreatic, renal or adrenal lesion.  Elongated liver spanning over 18.4 cm.  No calcified gallstones.  Age advanced atherosclerotic type changes with calcified aorta without aneurysmal dilation.  Sigmoid diverticula without evidence of extraluminal bowel inflammatory process, free fluid or free air.  Appendix not clearly visualized.  No bony destructive lesion.  Decompressed urinary bladder without gross abnormality.  IMPRESSION: Nonobstructing right renal calculi.  Elongated liver spanning over 18.4 cm.  Sigmoid diverticulosis.  Age advanced atherosclerotic type changes with calcified aorta without aneurysmal dilation.  Please see above.   Original Report Authenticated By: Lacy Duverney, M.D.      1. Pyelonephritis       MDM  Evaluation is consistent with urinary tract infection, possibly pyelonephritis. Patient is not toxic, and I do not suspect sepsis. CT scan showed multiple nonacute abnormalities that can be followed up, as an outpatient. The patient is informed of these findings.    Plan: Home Medications- Percocet; Home Treatments- rest; Recommended follow up- PCP 1 week   Flint Melter, MD 06/20/12 1640

## 2012-06-20 NOTE — ED Notes (Signed)
Patient transported to CT 

## 2012-06-20 NOTE — ED Notes (Signed)
C/o LLQ & flank pain, urinary frequency & dysuria x 3 days worsening this morning at 0400. Nausea no emesis. States taking OTC Azo with no relief.

## 2012-06-20 NOTE — ED Notes (Signed)
Pt up ambulatory at this time to the bathroom 

## 2012-06-20 NOTE — ED Notes (Signed)
Her by ems, woke up with llq, flank pain at 4 am, comes and goes, sharp in nature, rated 10/10. Painful urination, and urine Mahari Strahm in color and normally has blood disorder due to blood disorder. Pt reports being on blood thinner but none mentioned.

## 2012-06-21 LAB — URINE CULTURE
Colony Count: NO GROWTH
Culture: NO GROWTH

## 2014-01-18 ENCOUNTER — Ambulatory Visit (INDEPENDENT_AMBULATORY_CARE_PROVIDER_SITE_OTHER): Payer: Medicare Other | Admitting: Endocrinology

## 2014-01-18 ENCOUNTER — Encounter: Payer: Self-pay | Admitting: Endocrinology

## 2014-01-18 VITALS — BP 118/82 | HR 106 | Temp 97.7°F | Resp 14 | Ht 59.0 in | Wt 135.0 lb

## 2014-01-18 DIAGNOSIS — E041 Nontoxic single thyroid nodule: Secondary | ICD-10-CM

## 2014-01-18 DIAGNOSIS — Z23 Encounter for immunization: Secondary | ICD-10-CM

## 2014-01-18 LAB — TSH: TSH: 0.49 u[IU]/mL (ref 0.35–4.50)

## 2014-01-18 LAB — T4, FREE: Free T4: 0.99 ng/dL (ref 0.60–1.60)

## 2014-01-18 NOTE — Progress Notes (Addendum)
Patient ID: Tara Gonzales, female   DOB: 1965/08/02, 48 y.o.   MRN: [id number]            Reason for Appointment: Evaluation of thyroid nodule    History of Present Illness:   The patient's thyroid nodule was first discovered incidentally on a carotid ultrasound done in 08/2013 Thyroid ultrasound in 9/15 showed a hypoechoic solid nodules in the right lower pole measuring 1.4 cm Patient has had no local pressure or discomfort. No difficulty swallowing and no choking sensation  Does not appear to have had any recent thyroid function studies She is now referred here for further evaluation. She was told that she will need a biopsy of this However she appears to be on Coumadin for atrial fibrillation and history of pulmonary embolism  No unusual fatigue, heat or cold intolerance. Does get periodic hot flashes She tends to have some shakiness of her hands periodically No weakness in her legs    No results found for this basename: freeT4, tsh      Medication List       This list is accurate as of: 01/18/14  9:23 AM.  Always use your most recent med list.               fenofibrate 145 MG tablet  Commonly known as:  TRICOR  Take 145 mg by mouth daily.     Fish Oil 1000 MG Caps  Take 1 capsule by mouth daily.     HYDROcodone-acetaminophen 7.5-325 MG per tablet  Commonly known as:  NORCO  Take 1 tablet by mouth 2 (two) times daily.     metoprolol succinate 50 MG 24 hr tablet  Commonly known as:  TOPROL-XL  Take 50 mg by mouth daily. Take with or immediately following a meal.     multivitamin with minerals Tabs tablet  Take 1 tablet by mouth daily.     pramipexole 0.125 MG tablet  Commonly known as:  MIRAPEX  Take 0.125 mg by mouth 3 (three) times daily.     traZODone 100 MG tablet  Commonly known as:  DESYREL  Take 100 mg by mouth at bedtime.     warfarin 3 MG tablet  Commonly known as:  COUMADIN  Take 3 mg by mouth daily.        Allergies:  Allergies    Allergen Reactions  . Cymbalta [Duloxetine Hcl] Other (See Comments)    "brain shocks...flash of white while driving."  . Gabapentin Other (See Comments)    "gave me vertigo really bad"  . Lyrica [Pregabalin] Other (See Comments)    "gave me vertigo, and no pain relief."  . Savella [Milnacipran Hcl] Other (See Comments)    "no pain relief"    Past Medical History  Diagnosis Date  . Fibromyalgia   . Anxiety   . DVT (deep venous thrombosis)   . PE (pulmonary embolism)   . Depression   . Tachycardia     ongoing treatment for rate control    There is no history of radiation to the neck in childhood  Past Surgical History  Procedure Laterality Date  . Abdominal hysterectomy      No family history on file.  Social History:  reports that she has been smoking.  She has never used smokeless tobacco. Her alcohol and drug histories are not on file.   Review of Systems:  She has had history of atrial tachycardia for about 10 years. 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       She has a history of hyperlipidemia         No history of Diabetes.      GI: No change in bowel habits          Examination:   BP 118/82  Pulse 106  Temp(Src) 97.7 F (36.5 C)  Resp 14  Ht 4\' 11"  (1.499 m)  Wt 135 lb (61.236 kg)  BMI 27.25 kg/m2  SpO2 97%   General Appearance:  well-looking, averagely built and nourished         Eyes: No abnormal prominence or swelling of the eyes         THYROID: Thyroid nodule is  not palpable   There is no lymphadenopathy in the neck  Heart sounds normal Lungs clear Reflexes at biceps are normal.  Skin: No rash or lesions Extremities: No edema  Assessment/Plan:  Thyroid nodule: This should be evaluated further since it is a solitary solid nodule However will need to get clearance from her hematologist to stop the Coumadin for the procedure and not clear if this would be safe enough  Also need to get baseline thyroid function studies to rule out abnormal thyroid function If TSH is low may consider nuclear thyroid scan  Have given the patient the options of proceeding with needle aspiration after clearance from hematologist or repeating ultrasound in 4-6 months Discussed the low incidence of malignancy in thyroid nodules and usually good prognosis of small papillary thyroid cancers The patient is agreeable to repeating thyroid ultrasound but her daughter is more inclined to have the biopsy done   [facility] 01/18/2014   Addendum: Discussed with PCP Dr. Ella Jubilee and he will check with her hematologist and let me know about feasibility of stopping Coumadin  01/19/14: Thyroid levels are normal, have discussed with Dr. Ella Jubilee and he has clarified with his hematologist: They do not recommend her stopping Coumadin for a biopsy unless she takes Lovenox bridge. Explained to the patient that it would be preferable to wait and repeat ultrasound for type of nodule that she has and she agrees

## 2014-01-19 NOTE — Addendum Note (Signed)
Addended by: Elayne Snare on: 01/19/2014 12:42 PM   Modules accepted: Orders

## 2014-05-19 ENCOUNTER — Other Ambulatory Visit: Payer: PRIVATE HEALTH INSURANCE

## 2014-05-26 ENCOUNTER — Telehealth: Payer: Self-pay | Admitting: Endocrinology

## 2014-05-26 ENCOUNTER — Ambulatory Visit: Payer: PRIVATE HEALTH INSURANCE | Admitting: Endocrinology

## 2014-05-26 NOTE — Telephone Encounter (Signed)
Patient no showed today's appt. Please advise on how to follow up. °A. No follow up necessary. °B. Follow up urgent. Contact patient immediately. °C. Follow up necessary. Contact patient and schedule visit in ___ days. °D. Follow up advised. Contact patient and schedule visit in ____weeks. ° °

## 2014-05-27 NOTE — Telephone Encounter (Signed)
Follow up necessary. Contact patient and schedule visit asap

## 2014-06-08 NOTE — Telephone Encounter (Signed)
Lm for pt to call to reschedule

## 2014-06-18 NOTE — Telephone Encounter (Signed)
LM for pt to call to reschedule

## 2014-06-23 ENCOUNTER — Ambulatory Visit (INDEPENDENT_AMBULATORY_CARE_PROVIDER_SITE_OTHER): Payer: Medicare Other | Admitting: Endocrinology

## 2014-06-23 ENCOUNTER — Encounter: Payer: Self-pay | Admitting: Endocrinology

## 2014-06-23 VITALS — BP 107/70 | HR 101 | Temp 98.1°F | Resp 14 | Ht 59.0 in | Wt 141.2 lb

## 2014-06-23 DIAGNOSIS — E041 Nontoxic single thyroid nodule: Secondary | ICD-10-CM

## 2014-06-23 NOTE — Progress Notes (Signed)
Patient ID: Tara Gonzales, female   DOB: January 02, 1966, 49 y.o.   MRN: [id number]            Reason for Appointment: Follow-up of thyroid nodule    History of Present Illness:   The patient's thyroid nodule was first discovered incidentally on a carotid ultrasound done in 08/2013 Thyroid ultrasound in 9/15 showed a hypoechoic solid nodule in the right lower pole measuring 1.4 cm Patient has had no local pressure or discomfort.   She was told that she will need a biopsy of this nodule but because of being on Coumadin for atrial fibrillation and history of pulmonary embolism this was deferred.  Her hematologist felt that she needs to be on Lovenox bridge if she needs a biopsy  She has had normal thyroid levels  Lab Results  Component Value Date   TSH 0.49 01/18/2014   FREET4 0.99 01/18/2014    The patient was referred for follow-up ultrasound in 05/2014 but she did not show up for her appointment and is here for further management      Medication List       This list is accurate as of: 06/23/14  1:32 PM.  Always use your most recent med list.               fenofibrate 145 MG tablet  Commonly known as:  TRICOR  Take 145 mg by mouth daily.     HYDROcodone-acetaminophen 7.5-325 MG per tablet  Commonly known as:  NORCO  Take 1 tablet by mouth 2 (two) times daily.     Icosapent Ethyl 1 G Caps  Take by mouth. 4 capsules daily     metoprolol succinate 100 MG 24 hr tablet  Commonly known as:  TOPROL-XL  Take 100 mg by mouth daily. Take with or immediately following a meal.     metoprolol succinate 50 MG 24 hr tablet  Commonly known as:  TOPROL-XL  Take 50 mg by mouth daily. Take with or immediately following a meal.     multivitamin with minerals Tabs tablet  Take 1 tablet by mouth daily.     pramipexole 0.125 MG tablet  Commonly known as:  MIRAPEX  Take 0.125 mg by mouth 3 (three) times daily.     rosuvastatin 40 MG tablet  Commonly known as:  CRESTOR  Take 40 mg by  mouth daily.     traZODone 100 MG tablet  Commonly known as:  DESYREL  Take 100 mg by mouth at bedtime.     warfarin 3 MG tablet  Commonly known as:  COUMADIN  Take 3 mg by mouth daily.        Allergies:  Allergies  Allergen Reactions  . Cymbalta [Duloxetine Hcl] Other (See Comments)    "brain shocks...flash of white while driving."  . Gabapentin Other (See Comments)    "gave me vertigo really bad"  . Lyrica [Pregabalin] Other (See Comments)    "gave me vertigo, and no pain relief."  . Savella [Milnacipran Hcl] Other (See Comments)    "no pain relief"    Past Medical History  Diagnosis Date  . Fibromyalgia   . Anxiety   . DVT (deep venous thrombosis)   . PE (pulmonary embolism)   . Depression   . Tachycardia     ongoing treatment for rate control    There is no history of radiation to the neck in childhood  Past Surgical History  Procedure Laterality Date  . Abdominal hysterectomy  Family History  Problem Relation Age of Onset  . Thyroid disease Mother   . Heart disease Neg Hx   . Diabetes Neg Hx     Social History:  reports that she has been smoking.  She has never used smokeless tobacco. Her alcohol and drug histories are not on file.   Review of Systems:  She has had history of atrial tachycardia for about 10 years. 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  No local lymphadenopathy  Assessment/Plan:  Thyroid nodule: This should be evaluated further with repeat ultrasound since it is a solitary solid nodule  Further management will be  decided on results of this ultrasound and a biopsy is indicated will need to get her on a Lovenox bridge to help with her hematologist  Explained to the patient the nature of thyroid nodules and the need for further evaluation based on the ultrasound results   [facility] 06/23/2014

## 2014-07-06 ENCOUNTER — Other Ambulatory Visit: Payer: PRIVATE HEALTH INSURANCE

## 2014-07-15 ENCOUNTER — Other Ambulatory Visit: Payer: PRIVATE HEALTH INSURANCE

## 2014-07-26 ENCOUNTER — Other Ambulatory Visit: Payer: PRIVATE HEALTH INSURANCE

## 2014-07-28 ENCOUNTER — Other Ambulatory Visit: Payer: Medicare Other

## 2014-07-30 ENCOUNTER — Ambulatory Visit
Admission: RE | Admit: 2014-07-30 | Discharge: 2014-07-30 | Disposition: A | Discharge status: Home / Self Care | Payer: Medicare Other | Source: Ambulatory Visit | Attending: Endocrinology | Admitting: Endocrinology

## 2014-07-30 DIAGNOSIS — E041 Nontoxic single thyroid nodule: Secondary | ICD-10-CM

## 2014-08-01 NOTE — Progress Notes (Signed)
Quick Note:  Please let patient know that the ultrasound shows no change in the nodule size, no further evaluation needed but need to see her back in one year for follow-up and labs  ______

## 2018-05-01 ENCOUNTER — Emergency Department (HOSPITAL_COMMUNITY): Payer: Medicare HMO

## 2018-05-01 ENCOUNTER — Emergency Department (HOSPITAL_COMMUNITY)
Admission: EM | Admit: 2018-05-01 | Discharge: 2018-05-01 | Disposition: A | Discharge status: Home / Self Care | Payer: Medicare HMO | Attending: Emergency Medicine | Admitting: Emergency Medicine

## 2018-05-01 ENCOUNTER — Encounter (HOSPITAL_COMMUNITY): Payer: Self-pay

## 2018-05-01 ENCOUNTER — Other Ambulatory Visit: Payer: Self-pay

## 2018-05-01 DIAGNOSIS — G43909 Migraine, unspecified, not intractable, without status migrainosus: Secondary | ICD-10-CM

## 2018-05-01 DIAGNOSIS — F17210 Nicotine dependence, cigarettes, uncomplicated: Secondary | ICD-10-CM | POA: Diagnosis not present

## 2018-05-01 DIAGNOSIS — Z79899 Other long term (current) drug therapy: Secondary | ICD-10-CM | POA: Insufficient documentation

## 2018-05-01 LAB — PROTIME-INR
INR: 2.19
Prothrombin Time: 24.1 seconds — ABNORMAL HIGH (ref 11.4–15.2)

## 2018-05-01 MED ORDER — DIPHENHYDRAMINE HCL 50 MG/ML IJ SOLN
25.0000 mg | Freq: Once | INTRAMUSCULAR | Status: AC
  Administered 2018-05-01: 25 mg via INTRAVENOUS
  Filled 2018-05-01: qty 1

## 2018-05-01 MED ORDER — ONDANSETRON 4 MG PO TBDP
4.0000 mg | ORAL_TABLET | Freq: Once | ORAL | Status: AC
  Administered 2018-05-01: 4 mg via ORAL
  Filled 2018-05-01: qty 1

## 2018-05-01 MED ORDER — LORAZEPAM 2 MG/ML IJ SOLN
1.0000 mg | Freq: Once | INTRAMUSCULAR | Status: AC
  Administered 2018-05-01: 1 mg via INTRAMUSCULAR
  Filled 2018-05-01: qty 1

## 2018-05-01 MED ORDER — ACETAMINOPHEN 500 MG PO TABS
1000.0000 mg | ORAL_TABLET | Freq: Once | ORAL | Status: AC
  Administered 2018-05-01: 1000 mg via ORAL
  Filled 2018-05-01: qty 2

## 2018-05-01 NOTE — ED Triage Notes (Signed)
Patient from home, arrived via [facility] with c/o 3 day HA and nausea. Hx: afib (warfarin), HTN,  A/O on arrival, pain 7/10 HA and tearful (states cant take ibuprofen or aspirin or goodies) Meds: Cymbalta, warfarin, trazadone  Vitals on arrival 140 STA 136/98 20 RR 98%

## 2018-05-01 NOTE — ED Notes (Signed)
Late waste: 1 mg ativan wasted with [place] in stericycle after patient discharged (not available to waste in pyxis)

## 2018-05-01 NOTE — ED Provider Notes (Signed)
[facility] EMERGENCY DEPARTMENT Provider Note   CSN: [id number] Arrival date & time: 05/01/18  1107     History   Chief Complaint Chief Complaint  Patient presents with  . Headache  . Nausea    HPI Tara Gonzales is a 53 y.o. female.  53 year old female with prior medical history as detailed below presents for evaluation of headache.  Patient reports prior history of migraine headaches.  She reports a headache that is been lasting for the last 3 days.  She reports that the pain is typical of her migraine however the length of time that it is bothered her is not.  She denies associated fever.  She does report mild nausea without vomiting.  She denies associated visual changes, chest pain, shortness of breath, back pain, focal weakness, speech change, or other acute complaint.  The history is provided by the patient and medical records.  Headache  Pain location:  Frontal Quality:  Sharp and dull Radiates to:  Does not radiate Onset quality:  Gradual Duration:  3 days Timing:  Constant Progression:  Waxing and waning Chronicity:  Recurrent Similar to prior headaches: yes   Relieved by:  Nothing Worsened by:  Nothing Associated symptoms: no back pain, no fever, no neck stiffness, no numbness, no paresthesias, no visual change and no vomiting     Past Medical History:  Diagnosis Date  . Anxiety   . Depression   . DVT (deep venous thrombosis) ([place])   . Fibromyalgia   . PE (pulmonary embolism)   . Tachycardia    ongoing treatment for rate control    Patient Active Problem List   Diagnosis Date Noted  . Thyroid nodule 01/18/2014    Past Surgical History:  Procedure Laterality Date  . ABDOMINAL HYSTERECTOMY    . ANKLE FUSION Left   . SHOULDER ARTHROSCOPY Right      OB History   No obstetric history on file.      Home Medications    Prior to Admission medications   Medication Sig Start Date End Date Taking? Authorizing Provider    HYDROcodone-acetaminophen (NORCO/VICODIN) 5-325 MG tablet Take 1 tablet by mouth every 8 (eight) hours as needed for pain. 01/20/17 05/16/18 Yes [provider]  metoprolol succinate (TOPROL-XL) 100 MG 24 hr tablet Take 50-100 mg by mouth daily as needed (palpitation).    Yes [provider]  Multiple Vitamin (MULTIVITAMIN WITH MINERALS) TABS tablet Take 1 tablet by mouth daily.   Yes [provider]  pramipexole (MIRAPEX) 0.125 MG tablet Take 0.125 mg by mouth 3 (three) times daily.   Yes [provider]  simvastatin (ZOCOR) 40 MG tablet Take 40 mg by mouth every evening. 02/07/18  Yes [provider]  Vitamin D, Ergocalciferol, (DRISDOL) 1.25 MG (50000 UT) CAPS capsule Take 50,000 Units by mouth every Saturday. 06/29/15  Yes [provider]  warfarin (COUMADIN) 3 MG tablet Take 1.5-3 mg by mouth See admin instructions. Take 1 tablet (3mg ) all days EXCEPT, Thursday take 1/2 tablet 1.5mg    Yes [provider]    Family History Family History  Problem Relation Age of Onset  . Thyroid disease Mother   . Heart disease Neg Hx   . Diabetes Neg Hx     Social History Social History   Tobacco Use  . Smoking status: Current Every Day Smoker  . Smokeless tobacco: Never Used  Substance Use Topics  . Alcohol use: Not Currently  . Drug use: Never  Allergies   Buspar [buspirone]; Cymbalta [duloxetine hcl]; Gabapentin; Lyrica [pregabalin]; Savella [milnacipran hcl]; and Rivaroxaban   Review of Systems Review of Systems  Constitutional: Negative for fever.  Gastrointestinal: Negative for vomiting.  Musculoskeletal: Negative for back pain and neck stiffness.  Neurological: Positive for headaches. Negative for numbness and paresthesias.  All other systems reviewed and are negative.    Physical Exam Updated Vital Signs BP (!) 125/99   Pulse 92   Temp 98.6 F (37 C) (Oral)   Resp 13   Ht 4' 10.5" (1.486 m)   Wt 57.6 kg    SpO2 99%   BMI 26.09 kg/m   Physical Exam Vitals signs and nursing note reviewed.  Constitutional:      General: She is not in acute distress.    Appearance: She is well-developed.  HENT:     Head: Normocephalic and atraumatic.  Eyes:     General: No visual field deficit.    Conjunctiva/sclera: Conjunctivae normal.     Pupils: Pupils are equal, round, and reactive to light.  Neck:     Musculoskeletal: Normal range of motion and neck supple.  Cardiovascular:     Rate and Rhythm: Normal rate and regular rhythm.     Heart sounds: Normal heart sounds.  Pulmonary:     Effort: Pulmonary effort is normal. No respiratory distress.     Breath sounds: Normal breath sounds.  Abdominal:     General: There is no distension.     Palpations: Abdomen is soft.     Tenderness: There is no abdominal tenderness.  Musculoskeletal: Normal range of motion.        General: No deformity.  Skin:    General: Skin is warm and dry.  Neurological:     Mental Status: She is alert and oriented to person, place, and time. Mental status is at baseline.     GCS: GCS eye subscore is 4. GCS verbal subscore is 5. GCS motor subscore is 6.     Cranial Nerves: No cranial nerve deficit, dysarthria or facial asymmetry.     Sensory: No sensory deficit.     Motor: No weakness.     Coordination: Romberg sign negative. Coordination normal.     Gait: Gait normal.  Psychiatric:        Mood and Affect: Mood normal.      ED Treatments / Results  Labs (all labs ordered are listed, but only abnormal results are displayed) Labs Reviewed  PROTIME-INR - Abnormal; Notable for the following components:      Result Value   Prothrombin Time 24.1 (*)    All other components within normal limits    EKG None  Radiology Ct Head Wo Contrast  Result Date: 05/01/2018 CLINICAL DATA:  53 year old female with acute headache and nausea for 3 days EXAM: CT HEAD WITHOUT CONTRAST TECHNIQUE: Contiguous axial images were obtained  from the base of the skull through the vertex without intravenous contrast. COMPARISON:  05/25/2012 CT FINDINGS: Brain: No evidence of acute infarction, hemorrhage, hydrocephalus, extra-axial collection or mass lesion/mass effect. Vascular: No hyperdense vessel or unexpected calcification. Skull: Normal. Negative for fracture or focal lesion. Sinuses/Orbits: No acute finding. Other: None. IMPRESSION: Unremarkable noncontrast head CT. Electronically Signed   By: Margarette Canada M.D.   On: 05/01/2018 14:05    Procedures Procedures (including critical care time)  Medications Ordered in ED Medications  diphenhydrAMINE (BENADRYL) injection 25 mg (25 mg Intravenous Given 05/01/18 1144)  ondansetron (ZOFRAN-ODT) disintegrating tablet 4 mg (  4 mg Oral Given 05/01/18 1144)  acetaminophen (TYLENOL) tablet 1,000 mg (1,000 mg Oral Given 05/01/18 1144)  LORazepam (ATIVAN) injection 1 mg (1 mg Intramuscular Given 05/01/18 1447)     Initial Impression / Assessment and Plan / ED Course  I have reviewed the triage vital signs and the nursing notes.  Pertinent labs & imaging results that were available during my care of the patient were reviewed by me and considered in my medical decision making (see chart for details).     MDM  Screen complete  Patient is presenting for evaluation of reported migraine.  Patient on history and exam is without suggestion of more significant acute pathology.  Work-up today is without evidence of a significant acute pathology.  CT imaging is without evidence of any acute process.  INR is therapeutic.   She does feel improved following treatment in the ED.  She now desires discharge home.  Importance of close follow-up is stressed.  Strict return precautions given and understood.  Final Clinical Impressions(s) / ED Diagnoses   Final diagnoses:  Migraine without status migrainosus, not intractable, unspecified migraine type    ED Discharge Orders    None       Valarie Merino, MD 05/01/18 1513

## 2018-05-01 NOTE — Discharge Instructions (Addendum)
Please return for any problem.  Follow-up with your regular care provider as instructed. °

## 2019-11-09 ENCOUNTER — Telehealth: Payer: Self-pay | Admitting: Gastroenterology

## 2019-11-09 NOTE — Telephone Encounter (Signed)
Hey Dr. Havery Moros, patient is calling to schedule a colonoscopy. She has requested you as her doctor. Her records from last colonoscopy are in care everywhere. Please advise on scheduling once you return to the office. Thank you!

## 2019-11-10 NOTE — Telephone Encounter (Signed)
I'm happy to take care of this for her. However, looks like she is on a blood thinner for a history of blood clots and hypercoagulable state, I would prefer to see her in clinic first if you can book her for that. thanks

## 2019-11-11 NOTE — Telephone Encounter (Signed)
Phone went to busy signal. Will try back again.  (Does look like in patient chart that she is scheduled for a hospital colonoscopy on 08/18 with a different GI, if patient returns call please verify)

## 2019-11-23 ENCOUNTER — Encounter: Payer: Self-pay | Admitting: Gastroenterology

## 2020-01-28 ENCOUNTER — Ambulatory Visit: Payer: Medicare Other | Admitting: Gastroenterology

## 2020-02-04 ENCOUNTER — Telehealth: Payer: Self-pay | Admitting: *Deleted

## 2020-02-04 ENCOUNTER — Ambulatory Visit: Payer: Medicare HMO | Admitting: Physician Assistant

## 2020-02-04 ENCOUNTER — Encounter: Payer: Self-pay | Admitting: Physician Assistant

## 2020-02-04 VITALS — BP 110/80 | HR 102 | Ht 59.0 in | Wt 130.0 lb

## 2020-02-04 DIAGNOSIS — K219 Gastro-esophageal reflux disease without esophagitis: Secondary | ICD-10-CM

## 2020-02-04 DIAGNOSIS — R194 Change in bowel habit: Secondary | ICD-10-CM

## 2020-02-04 DIAGNOSIS — R1013 Epigastric pain: Secondary | ICD-10-CM

## 2020-02-04 DIAGNOSIS — Z7901 Long term (current) use of anticoagulants: Secondary | ICD-10-CM

## 2020-02-04 MED ORDER — NA SULFATE-K SULFATE-MG SULF 17.5-3.13-1.6 GM/177ML PO SOLN: 1.0000 | Freq: Once | ORAL | 0 refills | Status: AC

## 2020-02-04 MED ORDER — OMEPRAZOLE 20 MG PO CPDR: 20.0000 mg | DELAYED_RELEASE_CAPSULE | Freq: Every day | ORAL | 5 refills | Status: DC

## 2020-02-04 NOTE — Telephone Encounter (Signed)
[facility] Pre-operative Risk Assessment     Request for surgical clearance:     Endoscopy Procedure  What type of surgery is being performed?     EGD/Colonoscopy  When is this surgery scheduled?     Friday 03/18/20  What type of clearance is required ?   Pharmacy  Are there any medications that need to be held prior to surgery and how long? Coumadin 7 days  Practice name and name of physician performing surgery?      [facility]  What is your office phone and fax number?      Phone- [phone]  [phone]  Anesthesia type (None, local, MAC, general) ?       MAC

## 2020-02-04 NOTE — Patient Instructions (Addendum)
If you are age 52 or older, your body mass index should be between 23-30. Your Body mass index is 26.26 kg/m. If this is out of the aforementioned range listed, please consider follow up with your Primary Care Provider.  If you are age 75 or younger, your body mass index should be between 19-25. Your Body mass index is 26.26 kg/m. If this is out of the aformentioned range listed, please consider follow up with your Primary Care Provider.   Start a daily fiber supplement.   We have sent the following medications to your pharmacy for you to pick up at your convenience: Omeprazole 20 mg daily 20-30 minutes before breakfast.  You will be contaced by our office prior to your procedure for directions on holding your Coumadin/Warfarin.  If you do not hear from our office 1 week prior to your scheduled procedure, please call [phone] to discuss.  You have been scheduled for an endoscopy and colonoscopy. Please follow the written instructions given to you at your visit today. Please pick up your prep supplies at the pharmacy within the next 1-3 days. If you use inhalers (even only as needed), please bring them with you on the day of your procedure.  Due to recent changes in healthcare laws, you may see the results of your imaging and laboratory studies on MyChart before your provider has had a chance to review them.  We understand that in some cases there may be results that are confusing or concerning to you. Not all laboratory results come back in the same time frame and the provider may be waiting for multiple results in order to interpret others.  Please give [place] 48 hours in order for your provider to thoroughly review all the results before contacting the office for clarification of your results.

## 2020-02-04 NOTE — Progress Notes (Signed)
Chief Complaint: Epigastric pain, GERD and change in bowel habits  HPI:    Tara Gonzales is a 54 year old Caucasian female with a past medical history as listed below including DVT on Coumadin, Dr. Havery Moros is excepted her as a patient, who was referred to me by Secundino Ginger, PA-C for a complaint of GERD, epigastric pain and change in bowel habits.      Today, the patient resents to clinic and tells me that over the past couple of years she has noticed a change in bowel habits telling me that she cannot leave the house for the first 3 to 4 hours in the morning because she just keeps going back to the toilet to have "little bits of stool", mostly these are looser than normal, but can sometimes be more solid.    Also describes reflux for which she uses Tums on a daily basis, occasionally uses Nexium 20 mg but "it is so expensive".  This has also been occurring for years.  Denies any symptoms of dysphagia.    Also complains today of some sharp epigastric pains which have been occurring at least 2 times a week over the past couple of weeks, these seem related to eating but she is not really been able to pinpoint it, they seem to come and go pretty quickly.    Describes history of a previous colonoscopy and apparently was told to repeat in 1 year.    Denies fever, chills, weight loss, blood in her stool, nausea or vomiting.  Past Medical History:  Diagnosis Date  . Anticoagulated on Coumadin   . Anxiety    General anxiety disorder  . 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 B12 deficiency   . Carpal tunnel syndrome on left   . Clotting disorder ([place])   . Depression   . DVT (deep venous thrombosis) ([place])   . Fibromyalgia   . Hiatal hernia   . History of colon polyps   . History of diverticulitis   . History of pulmonary embolism   . Hyperlipidemia   . Hypertension   . Osteoarthritis of left subtalar joint    foot  . Osteochondritis dissecans of talus, left    foot  . PE (pulmonary  embolism)   . Tachycardia    ongoing treatment for rate control    Past Surgical History:  Procedure Laterality Date  . ABDOMINAL HYSTERECTOMY    . ANKLE FUSION Left   . SHOULDER ARTHROSCOPY Right     Current Outpatient Medications  Medication Sig Dispense Refill  . metoprolol succinate (TOPROL-XL) 100 MG 24 hr tablet Take 50-100 mg by mouth daily as needed (palpitation).     . Multiple Vitamin (MULTIVITAMIN WITH MINERALS) TABS tablet Take 1 tablet by mouth daily.    . pramipexole (MIRAPEX) 0.125 MG tablet Take 0.125 mg by mouth 3 (three) times daily.    . simvastatin (ZOCOR) 40 MG tablet Take 40 mg by mouth every evening.    . Vitamin D, Ergocalciferol, (DRISDOL) 1.25 MG (50000 UT) CAPS capsule Take 50,000 Units by mouth every Saturday.    . warfarin (COUMADIN) 3 MG tablet Take 1.5-3 mg by mouth See admin instructions. Take 1 tablet (53m) all days EXCEPT, Thursday take 1/2 tablet 1.582m   . Na Sulfate-K Sulfate-Mg Sulf 17.5-3.13-1.6 GM/177ML SOLN Take 1 kit by mouth once for 1 dose. 354 mL 0   No current facility-administered medications for this visit.    Allergies as of 02/04/2020 -  Review Complete 02/04/2020  Allergen Reaction Noted  . Buspar [buspirone] Hives 05/01/2018  . Cymbalta [duloxetine hcl] Other (See Comments) 11/10/2011  . Delsym [dextromethorphan]  01/23/2020  . Docusate sodium  01/23/2020  . Gabapentin Other (See Comments) 11/10/2011  . Lyrica [pregabalin] Other (See Comments) 11/10/2011  . Savella [milnacipran hcl] Other (See Comments) 11/10/2011  . Rivaroxaban Rash 04/07/2015    Family History  Problem Relation Age of Onset  . Thyroid disease Mother   . Heart disease Neg Hx   . Diabetes Neg Hx     Social History   Socioeconomic History  . Marital status: Single    Spouse name: Not on file  . Number of children: Not on file  . Years of education: Not on file  . Highest education level: Not on file  Occupational History  . Not on file  Tobacco  Use  . Smoking status: Current Every Day Smoker  . Smokeless tobacco: Never Used  Substance and Sexual Activity  . Alcohol use: Not Currently  . Drug use: Never  . Sexual activity: Not Currently  Other Topics Concern  . Not on file  Social History Narrative  . Not on file   Social Determinants of Health   Financial Resource Strain:   . Difficulty of Paying Living Expenses: Not on file  Food Insecurity:   . Worried About Charity fundraiser in the Last Year: Not on file  . Ran Out of Food in the Last Year: Not on file  Transportation Needs:   . Lack of Transportation (Medical): Not on file  . Lack of Transportation (Non-Medical): Not on file  Physical Activity:   . Days of Exercise per Week: Not on file  . Minutes of Exercise per Session: Not on file  Stress:   . Feeling of Stress : Not on file  Social Connections:   . Frequency of Communication with Friends and Family: Not on file  . Frequency of Social Gatherings with Friends and Family: Not on file  . Attends Religious Services: Not on file  . Active Member of Clubs or Organizations: Not on file  . Attends Archivist Meetings: Not on file  . Marital Status: Not on file  Intimate Partner Violence:   . Fear of Current or Ex-Partner: Not on file  . Emotionally Abused: Not on file  . Physically Abused: Not on file  . Sexually Abused: Not on file    Review of Systems:    Constitutional: No weight loss, fever or chills Skin: No rash  Cardiovascular: No chest pain Respiratory: No SOB  Gastrointestinal: See HPI and otherwise negative Genitourinary: No dysuria Neurological: No headache, dizziness or syncope Musculoskeletal: No new muscle or joint pain Hematologic: No bleeding  Psychiatric: No history of depression or anxiety   Physical Exam:  Vital signs: BP 110/80 (BP Location: Left Arm, Patient Position: Sitting)   Pulse (!) 102   Ht 4' 11" (1.499 m)   Wt 130 lb (59 kg)   SpO2 98%   BMI 26.26 kg/m    Constitutional:   Pleasant Caucasian female appears to be in NAD, Well developed, Well nourished, alert and cooperative Head:  Normocephalic and atraumatic. Eyes:   PEERL, EOMI. No icterus. Conjunctiva pink. Ears:  Normal auditory acuity. Neck:  Supple Throat: Oral cavity and pharynx without inflammation, swelling or lesion.  Respiratory: Respirations even and unlabored. Lungs clear to auscultation bilaterally.   No wheezes, crackles, or rhonchi.  Cardiovascular: Normal S1, S2. No  MRG. Regular rate and rhythm. No peripheral edema, cyanosis or pallor.  Gastrointestinal:  Soft, nondistended, nontender. No rebound or guarding. Normal bowel sounds. No appreciable masses or hepatomegaly. Rectal:  Not performed.  Msk:  Symmetrical without gross deformities. Without edema, no deformity or joint abnormality.  Neurologic:  Alert and  oriented x4;  grossly normal neurologically.  Skin:   Dry and intact without significant lesions or rashes. Psychiatric: Demonstrates good judgement and reason without abnormal affect or behaviors.  No recent labs or imaging.  Assessment: 1.  GERD: Daily symptoms for which she uses Tums; consider gastritis+/-PUD+/-H. pylori 2.  Epigastric pain: Sharp shooting pains, consider relationship to above 3.  Change in bowel habits: Over the past couple of years, seems to have multiple bowel movements in the morning; consider constipation versus IBS versus other  Plan: 1.  Scheduled patient for diagnostic colonoscopy and EGD in the [place] with Dr. Havery Moros.  Did discuss risks, benefits, limitations and alternatives and patient agrees to proceed.  She will be Covid tested 2 days prior to time of procedure. 2.  Recommend the patient start a fiber supplement such as Metamucil, Citrucel or Benefiber on a regular basis to help with her change in bowel habits. 3.  Prescribed Omeprazole 20 mg daily, 30-60 minutes for breakfast #30 with 5 refills. 4.  Reviewed antireflux diet and  lifestyle modifications. 5.  Advised the patient to hold her Coumadin for 7 days prior to time of procedure.  We will communicate with the Coumadin clinic to ensure this is acceptable for her.  She may need to be on a Lovenox bridge given she has had to do this in the past. 6.  Patient to follow in clinic per recommendations from Dr. Havery Moros after time of procedures  Ellouise Newer, PA-C [facility] 02/04/2020, 3:58 PM  Cc: Secundino Ginger, PA-C

## 2020-02-04 NOTE — Telephone Encounter (Signed)
Patient's coumadin is managed by [facility]. She is new to our group. Please send the pharmacy clearance to her [facility] coumadin clinic. Thank you.

## 2020-02-05 NOTE — Progress Notes (Signed)
Agree with assessment and plan as outlined. I think okay to hold coumadin 5 days pre-procedure.

## 2020-02-08 NOTE — Telephone Encounter (Signed)
Faxed letter to [facility].

## 2020-02-08 NOTE — Telephone Encounter (Signed)
-----   Message from Levin Erp, [place] sent at 02/05/2020  8:50 AM EDT ----- Regarding: Dr. Serita Butcher says coumadin hold 5 days Dr. Arm says ok to hold coumadin 5 days- can you communicate that with coumadin clinic. Thanks ----- Message ----- From: Yetta Flock, MD Sent: 02/05/2020   8:00 AM EDT To: Levin Erp, PA    ----- Message ----- From: Levin Erp, [place] Sent: 02/04/2020   4:05 PM EDT To: Yetta Flock, MD

## 2020-02-11 NOTE — Telephone Encounter (Signed)
Patient received clearance and [facility] will coordinate bridging with Lovenox.

## 2020-03-07 ENCOUNTER — Ambulatory Visit: Payer: Medicare Other | Admitting: Gastroenterology

## 2020-03-16 ENCOUNTER — Other Ambulatory Visit: Payer: Self-pay | Admitting: Gastroenterology

## 2020-03-17 LAB — SARS CORONAVIRUS 2 (TAT 6-24 HRS): SARS Coronavirus 2: NEGATIVE

## 2020-03-18 ENCOUNTER — Ambulatory Visit (AMBULATORY_SURGERY_CENTER): Payer: Medicare HMO | Admitting: Gastroenterology

## 2020-03-18 ENCOUNTER — Encounter: Payer: Self-pay | Admitting: Gastroenterology

## 2020-03-18 ENCOUNTER — Other Ambulatory Visit: Payer: Self-pay

## 2020-03-18 VITALS — BP 110/52 | HR 91 | Temp 97.0°F | Resp 15 | Ht 59.0 in | Wt 130.0 lb

## 2020-03-18 DIAGNOSIS — R194 Change in bowel habit: Secondary | ICD-10-CM

## 2020-03-18 DIAGNOSIS — K621 Rectal polyp: Secondary | ICD-10-CM | POA: Diagnosis not present

## 2020-03-18 DIAGNOSIS — K319 Disease of stomach and duodenum, unspecified: Secondary | ICD-10-CM

## 2020-03-18 DIAGNOSIS — Z860100 Personal history of colon polyps, unspecified: Secondary | ICD-10-CM

## 2020-03-18 DIAGNOSIS — D129 Benign neoplasm of anus and anal canal: Secondary | ICD-10-CM

## 2020-03-18 DIAGNOSIS — K2970 Gastritis, unspecified, without bleeding: Secondary | ICD-10-CM

## 2020-03-18 DIAGNOSIS — R197 Diarrhea, unspecified: Secondary | ICD-10-CM

## 2020-03-18 DIAGNOSIS — D128 Benign neoplasm of rectum: Secondary | ICD-10-CM

## 2020-03-18 DIAGNOSIS — R1013 Epigastric pain: Secondary | ICD-10-CM

## 2020-03-18 DIAGNOSIS — D127 Benign neoplasm of rectosigmoid junction: Secondary | ICD-10-CM | POA: Diagnosis not present

## 2020-03-18 DIAGNOSIS — Z7901 Long term (current) use of anticoagulants: Secondary | ICD-10-CM

## 2020-03-18 DIAGNOSIS — K219 Gastro-esophageal reflux disease without esophagitis: Secondary | ICD-10-CM

## 2020-03-18 DIAGNOSIS — K635 Polyp of colon: Secondary | ICD-10-CM

## 2020-03-18 DIAGNOSIS — D123 Benign neoplasm of transverse colon: Secondary | ICD-10-CM

## 2020-03-18 DIAGNOSIS — K5900 Constipation, unspecified: Secondary | ICD-10-CM

## 2020-03-18 DIAGNOSIS — D125 Benign neoplasm of sigmoid colon: Secondary | ICD-10-CM

## 2020-03-18 DIAGNOSIS — K5730 Diverticulosis of large intestine without perforation or abscess without bleeding: Secondary | ICD-10-CM | POA: Diagnosis not present

## 2020-03-18 DIAGNOSIS — D126 Benign neoplasm of colon, unspecified: Secondary | ICD-10-CM

## 2020-03-18 DIAGNOSIS — K2950 Unspecified chronic gastritis without bleeding: Secondary | ICD-10-CM | POA: Diagnosis not present

## 2020-03-18 MED ORDER — SODIUM CHLORIDE 0.9 % IV SOLN: 500.0000 mL | Freq: Once | INTRAVENOUS | Status: DC

## 2020-03-18 NOTE — Progress Notes (Signed)
Called to room to assist during endoscopic procedure.  Patient ID and intended procedure confirmed with present staff. Received instructions for my participation in the procedure from the performing physician.  

## 2020-03-18 NOTE — Patient Instructions (Addendum)
Handouts were given to you on a hiatal hernia, polyps, and diverticulosis Resume your COUMADIN tonight. Resume LOVENOX tomorrow afternoon (after 3:00 pm) in light of colon polyps removed today - further recommendations on length of lovenox dosing per prescribing provider. Trial of daily Fiber supplement - Over-the-counter.  You may resume your current medications today. Await biopsy results. Please call if any questions or concerns.    YOU HAD AN ENDOSCOPIC PROCEDURE TODAY AT [facility]:   Refer to the procedure report that was given to you for any specific questions about what was found during the examination.  If the procedure report does not answer your questions, please call your gastroenterologist to clarify.  If you requested that your care partner not be given the details of your procedure findings, then the procedure report has been included in a sealed envelope for you to review at your convenience later.  YOU SHOULD EXPECT: Some feelings of bloating in the abdomen. Passage of more gas than usual.  Walking can help get rid of the air that was put into your GI tract during the procedure and reduce the bloating. If you had a lower endoscopy (such as a colonoscopy or flexible sigmoidoscopy) you may notice spotting of blood in your stool or on the toilet paper. If you underwent a bowel prep for your procedure, you may not have a normal bowel movement for a few days.  Please Note:  You might notice some irritation and congestion in your nose or some drainage.  This is from the oxygen used during your procedure.  There is no need for concern and it should clear up in a day or so.  SYMPTOMS TO REPORT IMMEDIATELY:   Following lower endoscopy (colonoscopy or flexible sigmoidoscopy):  Excessive amounts of blood in the stool  Significant tenderness or worsening of abdominal pains  Swelling of the abdomen that is new, acute  Fever of 100F or higher   Following upper  endoscopy (EGD)  Vomiting of blood or coffee ground material  New chest pain or pain under the shoulder blades  Painful or persistently difficult swallowing  New shortness of breath  Fever of 100F or higher  Black, tarry-looking stools  For urgent or emergent issues, a gastroenterologist can be reached at any hour by calling [phone]. Do not use MyChart messaging for urgent concerns.    DIET:  We do recommend a small meal at first, but then you may proceed to your regular diet.  Drink plenty of fluids but you should avoid alcoholic beverages for 24 hours.  ACTIVITY:  You should plan to take it easy for the rest of today and you should NOT DRIVE or use heavy machinery until tomorrow (because of the sedation medicines used during the test).    FOLLOW UP: Our staff will call the number listed on your records 48-72 hours following your procedure to check on you and address any questions or concerns that you may have regarding the information given to you following your procedure. If we do not reach you, we will leave a message.  We will attempt to reach you two times.  During this call, we will ask if you have developed any symptoms of COVID 19. If you develop any symptoms (ie: fever, flu-like symptoms, shortness of breath, cough etc.) before then, please call [phone].  If you test positive for Covid 19 in the 2 weeks post procedure, please call and report this information to [place].    If any  biopsies were taken you will be contacted by phone or by letter within the next 1-3 weeks.  Please call us at [phone] if you have not heard about the biopsies in 3 weeks.    SIGNATURES/CONFIDENTIALITY: You and/or your care partner have signed paperwork which will be entered into your electronic medical record.  These signatures attest to the fact that that the information above on your After Visit Summary has been reviewed and is understood.  Full responsibility of the confidentiality of this  discharge information lies with you and/or your care-partner.

## 2020-03-18 NOTE — Progress Notes (Signed)
Report to PACU, RN, vss, BBS= Clear.  

## 2020-03-18 NOTE — Progress Notes (Signed)
VS by [place]. 

## 2020-03-18 NOTE — Progress Notes (Addendum)
Per Dr. Havery Moros, pt to restart Lovenox injection tomorrow after 3:00 pm.  Pt made aware of this and written on her AVS.  No problems noted in the recovery room. Maw

## 2020-03-18 NOTE — Op Note (Addendum)
[facility] Patient Name: Tara Gonzales Procedure Date: 03/18/2020 2:28 PM MRN: [id number] Endoscopist: Remo Lipps P. Havery Moros , MD Age: 54 Referring MD:  Date of Birth: 1965-08-30 Gender: Female Account #: [id number] Procedure:                Upper GI endoscopy Indications:              Epigastric abdominal pain, on trial of omeprazole                            with ? improvement Medicines:                Monitored Anesthesia Care Procedure:                Pre-Anesthesia Assessment:                           - Prior to the procedure, a History and Physical                            was performed, and patient medications and                            allergies were reviewed. The patient's tolerance of                            previous anesthesia was also reviewed. The risks                            and benefits of the procedure and the sedation                            options and risks were discussed with the patient.                            All questions were answered, and informed consent                            was obtained. Prior Anticoagulants: The patient has                            taken Lovenox (enoxaparin), last dose was 1 day                            prior to procedure. Last dose Coumadin 5 days ago.                            ASA Grade Assessment: II - A patient with mild                            systemic disease. After reviewing the risks and                            benefits, the patient was deemed in satisfactory  condition to undergo the procedure.                           After obtaining informed consent, the endoscope was                            passed under direct vision. Throughout the                            procedure, the patient's blood pressure, pulse, and                            oxygen saturations were monitored continuously. The                            Endoscope was introduced through the  mouth, and                            advanced to the second part of duodenum. The upper                            GI endoscopy was accomplished without difficulty.                            The patient tolerated the procedure well. Scope In: Scope Out: Findings:                 Esophagogastric landmarks were identified: the                            Z-line was found at 38 cm, the gastroesophageal                            junction was found at 38 cm and the upper extent of                            the gastric folds was found at 39 cm from the                            incisors. Z line slightly irregular but did not                            meet criteria for Barrett's biopsies.                           A 1 cm hiatal hernia was present.                           The exam of the esophagus was otherwise normal.                           The entire examined stomach was normal. Biopsies  were taken with a cold forceps for Helicobacter                            pylori testing.                           The duodenal bulb and second portion of the                            duodenum were normal. Biopsies for histology were                            taken with a cold forceps for evaluation of celiac                            disease. Complications:            No immediate complications. Estimated blood loss:                            Minimal. Estimated Blood Loss:     Estimated blood loss was minimal. Impression:               - Esophagogastric landmarks identified.                           - 1 cm hiatal hernia.                           - Normal esophagus otherwise                           - Normal stomach. Biopsied to rule out H pylori.                           - Normal duodenal bulb and second portion of the                            duodenum. Biopsied to rule out celiac disease in                            light of bowel symptoms. Recommendation:            - Patient has a contact number available for                            emergencies. The signs and symptoms of potential                            delayed complications were discussed with the                            patient. Return to normal activities tomorrow.                            Written discharge instructions were provided to the  patient.                           - Resume previous diet.                           - Continue present medications.                           - Resume Lovenox tomorrow in light of polyps                            removed today                           - Resume Coumadin tonight                           - Await pathology results with further                            recommendations. Remo Lipps P. Justyn Boyson, MD 03/18/2020 3:27:03 PM This report has been signed electronically.

## 2020-03-18 NOTE — Op Note (Addendum)
[place] Patient Name: Tara Gonzales Procedure Date: 03/18/2020 2:28 PM MRN: [id number] Endoscopist: Remo Lipps P. Havery Moros , MD Age: 54 Referring MD:  Date of Birth: 27-Nov-1965 Gender: Female Account #: [id number] Procedure:                Colonoscopy Indications:              High risk colon cancer surveillance: Personal                            history of colonic polyps, also altered bowel                            habits with alternating loose stools /                            constipation, loose stools seem to predominate - on                            coumadin which was held for 5 days, on lovenox                            bridge Medicines:                Monitored Anesthesia Care Procedure:                Pre-Anesthesia Assessment:                           - Prior to the procedure, a History and Physical                            was performed, and patient medications and                            allergies were reviewed. The patient's tolerance of                            previous anesthesia was also reviewed. The risks                            and benefits of the procedure and the sedation                            options and risks were discussed with the patient.                            All questions were answered, and informed consent                            was obtained. Prior Anticoagulants: The patient has                            taken Lovenox (enoxaparin), last dose was 1 day  prior to procedure. Coumad held for 5 days. ASA                            Grade Assessment: II - A patient with mild systemic                            disease. After reviewing the risks and benefits,                            the patient was deemed in satisfactory condition to                            undergo the procedure.                           After obtaining informed consent, the colonoscope                            was  passed under direct vision. Throughout the                            procedure, the patient's blood pressure, pulse, and                            oxygen saturations were monitored continuously. The                            Olympus PFC-[id number] (#[id number]) Colonoscope was                            introduced through the anus and advanced to the the                            terminal ileum, with identification of the                            appendiceal orifice and IC valve. The colonoscopy                            was performed without difficulty. The patient                            tolerated the procedure well. The quality of the                            bowel preparation was good. The terminal ileum,                            ileocecal valve, appendiceal orifice, and rectum                            were photographed. Scope In: 2:47:51 PM Scope Out: 3:13:40 PM Scope Withdrawal Time: 0 hours 16 minutes 41 seconds  Total Procedure Duration: 0 hours  25 minutes 49 seconds  Findings:                 The perianal and digital rectal examinations were                            normal.                           The terminal ileum appeared normal.                           A 4 mm polyp was found in the hepatic flexure. The                            polyp was sessile. The polyp was removed with a                            cold snare. Resection and retrieval were complete.                           Two sessile polyps were found in the transverse                            colon. The polyps were 4 to 6 mm in size. These                            polyps were removed with a cold snare. Resection                            and retrieval were complete.                           Five sessile polyps were found in the sigmoid                            colon. The polyps were 3 to 15 mm in size (two > 1                            cm in size). These polyps were removed with a cold                             snare. Resection and retrieval were complete.                           A 4 mm polyp was found in the rectum. The polyp was                            sessile. The polyp was removed with a cold snare.                            Resection and retrieval were complete.  Multiple medium-mouthed diverticula were found in                            the entire colon.                           The exam was otherwise without abnormality.                            Retroflexed views not obtained given small size of                            the rectum.                           Biopsies for histology were taken with a cold                            forceps from the right colon, left colon and                            transverse colon for evaluation of microscopic                            colitis. Complications:            No immediate complications. Estimated blood loss:                            Minimal. Estimated Blood Loss:     Estimated blood loss was minimal. Impression:               - The examined portion of the ileum was normal.                           - One 4 mm polyp at the hepatic flexure, removed                            with a cold snare. Resected and retrieved.                           - Two 4 to 6 mm polyps in the transverse colon,                            removed with a cold snare. Resected and retrieved.                           - Five 3 to 15 mm polyps in the sigmoid colon,                            removed with a cold snare. Resected and retrieved.                           - One 4 mm polyp in the rectum, removed with a cold  snare. Resected and retrieved.                           - Diverticulosis in the entire examined colon.                           - The examination was otherwise normal.                           - Biopsies were taken with a cold forceps from the                             right colon, left colon and transverse colon for                            evaluation of microscopic colitis. Recommendation:           - Patient has a contact number available for                            emergencies. The signs and symptoms of potential                            delayed complications were discussed with the                            patient. Return to normal activities tomorrow.                            Written discharge instructions were provided to the                            patient.                           - Resume previous diet.                           - Continue present medications.                           - Resume Coumadin tonight                           - Resume Lovenox tomorrow - further recommendations                            on length of lovenox dosing per prescribing provider                           - Await pathology results.                           - Trial of daily fiber supplement Alexa Golebiewski P. Havery Moros, MD 03/18/2020 3:22:23 PM This report has been signed electronically.

## 2020-03-22 ENCOUNTER — Telehealth: Payer: Self-pay

## 2020-03-22 NOTE — Telephone Encounter (Signed)
  Follow up Call-  Call back number 03/18/2020  Post procedure Call Back phone  # [phone]  Permission to leave phone message Yes  Some recent data might be hidden     Patient questions:  Do you have a fever, pain , or abdominal swelling? No. Pain Score  0 *  Have you tolerated food without any problems? Yes.    Have you been able to return to your normal activities? Yes.    Do you have any questions about your discharge instructions: Diet   No. Medications  No. Follow up visit  No.  Do you have questions or concerns about your Care? No.  Actions: * If pain score is 4 or above: No action needed, pain <4.  1. Have you developed a fever since your procedure? no  2.   Have you had an respiratory symptoms (SOB or cough) since your procedure? no  3.   Have you tested positive for COVID 19 since your procedure no  4.   Have you had any family members/close contacts diagnosed with the COVID 19 since your procedure?  no   If yes to any of these questions please route to Joylene John, RN and Joella Prince, RN

## 2020-03-22 NOTE — Telephone Encounter (Signed)
°  Follow up Call-  Call back number 03/18/2020  Post procedure Call Back phone  # [phone]  Permission to leave phone message Yes  Some recent data might be hidden     1st follow up call made.  NALM

## 2020-06-19 ENCOUNTER — Emergency Department (HOSPITAL_COMMUNITY)
Admission: EM | Admit: 2020-06-19 | Discharge: 2020-06-19 | Disposition: A | Discharge status: Home / Self Care | Payer: Medicare HMO | Attending: Emergency Medicine | Admitting: Emergency Medicine

## 2020-06-19 ENCOUNTER — Emergency Department (HOSPITAL_COMMUNITY): Payer: Medicare HMO

## 2020-06-19 ENCOUNTER — Encounter (HOSPITAL_COMMUNITY): Payer: Self-pay

## 2020-06-19 ENCOUNTER — Other Ambulatory Visit: Payer: Self-pay

## 2020-06-19 DIAGNOSIS — I10 Essential (primary) hypertension: Secondary | ICD-10-CM | POA: Insufficient documentation

## 2020-06-19 DIAGNOSIS — K429 Umbilical hernia without obstruction or gangrene: Secondary | ICD-10-CM

## 2020-06-19 DIAGNOSIS — Z7901 Long term (current) use of anticoagulants: Secondary | ICD-10-CM | POA: Diagnosis not present

## 2020-06-19 DIAGNOSIS — F17200 Nicotine dependence, unspecified, uncomplicated: Secondary | ICD-10-CM | POA: Insufficient documentation

## 2020-06-19 DIAGNOSIS — Z79899 Other long term (current) drug therapy: Secondary | ICD-10-CM | POA: Diagnosis not present

## 2020-06-19 DIAGNOSIS — R1033 Periumbilical pain: Secondary | ICD-10-CM

## 2020-06-19 DIAGNOSIS — I4891 Unspecified atrial fibrillation: Secondary | ICD-10-CM | POA: Diagnosis not present

## 2020-06-19 DIAGNOSIS — R1013 Epigastric pain: Secondary | ICD-10-CM | POA: Diagnosis present

## 2020-06-19 LAB — CBC WITH DIFFERENTIAL/PLATELET
Abs Immature Granulocytes: 0.09 10*3/uL — ABNORMAL HIGH (ref 0.00–0.07)
Basophils Absolute: 0.1 10*3/uL (ref 0.0–0.1)
Basophils Relative: 1 %
Eosinophils Absolute: 0.1 10*3/uL (ref 0.0–0.5)
Eosinophils Relative: 1 %
HCT: 40.4 % (ref 36.0–46.0)
Hemoglobin: 13.2 g/dL (ref 12.0–15.0)
Immature Granulocytes: 1 %
Lymphocytes Relative: 19 %
Lymphs Abs: 1.9 10*3/uL (ref 0.7–4.0)
MCH: 28.9 pg (ref 26.0–34.0)
MCHC: 32.7 g/dL (ref 30.0–36.0)
MCV: 88.6 fL (ref 80.0–100.0)
Monocytes Absolute: 0.7 10*3/uL (ref 0.1–1.0)
Monocytes Relative: 6 %
Neutro Abs: 7.6 10*3/uL (ref 1.7–7.7)
Neutrophils Relative %: 72 %
Platelets: 273 10*3/uL (ref 150–400)
RBC: 4.56 MIL/uL (ref 3.87–5.11)
RDW: 15 % (ref 11.5–15.5)
WBC: 10.5 10*3/uL (ref 4.0–10.5)
nRBC: 0 % (ref 0.0–0.2)

## 2020-06-19 LAB — COMPREHENSIVE METABOLIC PANEL
ALT: 25 U/L (ref 0–44)
AST: 23 U/L (ref 15–41)
Albumin: 4 g/dL (ref 3.5–5.0)
Alkaline Phosphatase: 74 U/L (ref 38–126)
Anion gap: 10 (ref 5–15)
BUN: 8 mg/dL (ref 6–20)
CO2: 24 mmol/L (ref 22–32)
Calcium: 9.1 mg/dL (ref 8.9–10.3)
Chloride: 106 mmol/L (ref 98–111)
Creatinine, Ser: 0.67 mg/dL (ref 0.44–1.00)
GFR, Estimated: >60 mL/min (ref >60)
Glucose, Bld: 83 mg/dL (ref 70–99)
Potassium: 4 mmol/L (ref 3.5–5.1)
Sodium: 140 mmol/L (ref 135–145)
Total Bilirubin: 0.6 mg/dL (ref 0.3–1.2)
Total Protein: 6.9 g/dL (ref 6.5–8.1)

## 2020-06-19 LAB — LIPASE, BLOOD: Lipase: 32 U/L (ref 11–51)

## 2020-06-19 MED ORDER — SODIUM CHLORIDE 0.9 % IV BOLUS
500.0000 mL | Freq: Once | INTRAVENOUS | Status: AC
  Administered 2020-06-19: 500 mL via INTRAVENOUS

## 2020-06-19 MED ORDER — ONDANSETRON HCL 4 MG/2ML IJ SOLN
4.0000 mg | Freq: Once | INTRAMUSCULAR | Status: AC
  Administered 2020-06-19: 4 mg via INTRAVENOUS
  Filled 2020-06-19: qty 2

## 2020-06-19 MED ORDER — IOHEXOL 300 MG/ML  SOLN
100.0000 mL | Freq: Once | INTRAMUSCULAR | Status: AC | PRN
  Administered 2020-06-19: 100 mL via INTRAVENOUS

## 2020-06-19 MED ORDER — MORPHINE SULFATE (PF) 4 MG/ML IV SOLN
4.0000 mg | Freq: Once | INTRAVENOUS | Status: AC
  Administered 2020-06-19: 4 mg via INTRAVENOUS
  Filled 2020-06-19: qty 1

## 2020-06-19 NOTE — ED Provider Notes (Signed)
[facility] DEPT Provider Note   CSN: [id number] Arrival date & time: 06/19/20  1717     History Chief Complaint  Patient presents with  . Hernia    Tara Gonzales is a 55 y.o. female.  HPI   Patient with significant medical history of anxiety, hypercoagulopathy syndrome currently on Coumadin, PEs, DVTs, fibromyalgia presents with chief complaint of epigastric pain.  Patient endorses that pain started yesterday after she was lifting up a bucket of water, she endorses a sharp stabbing sensation in the middle of her stomach, does not radiate, was associated with nausea without vomiting, no constipation, diarrhea, no urinary symptoms.  She endorses that she has noticed some swelling on her bellybutton, she endorsed that she has had swelling of her bellybutton for years and thinks that she may have a umbilical hernia.  She has no significant abdominal history, no history of stomach ulcers, pancreatitis, gallbladder or liver abnormalities.  She denies  alleviating factors.  Patient has headaches, fevers, chills, shortness breath, chest pain, vomiting, diarrhea, worsening pedal edema.  Past Medical History:  Diagnosis Date  . Anticoagulated on Coumadin   . Anxiety    General anxiety disorder  . Atrial fibrillation (HCC)    occasional  . 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 B12 deficiency   . Carpal tunnel syndrome on left   . Clotting disorder (HCC)    thrombophilia factor 2  . Depression   . DVT (deep venous thrombosis) ([place])   . Fibromyalgia   . Hiatal hernia   . History of colon polyps   . History of diverticulitis   . History of pulmonary embolism   . Hyperlipidemia   . Hypertension   . Osteoarthritis of left subtalar joint    foot  . Osteochondritis dissecans of talus, left    foot  . PE (pulmonary embolism)   . Tachycardia    ongoing treatment for rate control    Patient Active Problem List   Diagnosis Date Noted  . Thyroid nodule  01/18/2014    Past Surgical History:  Procedure Laterality Date  . ABDOMINAL HYSTERECTOMY    . ANKLE FUSION Left   . SHOULDER ARTHROSCOPY Right      OB History   No obstetric history on file.     Family History  Problem Relation Age of Onset  . Thyroid disease Mother   . Colon cancer Maternal Uncle   . Heart disease Neg Hx   . Diabetes Neg Hx   . Esophageal cancer Neg Hx   . Rectal cancer Neg Hx   . Stomach cancer Neg Hx     Social History   Tobacco Use  . Smoking status: Current Every Day Smoker    Packs/day: 0.50  . Smokeless tobacco: Never Used  Substance Use Topics  . Alcohol use: Not Currently  . Drug use: Never    Home Medications Prior to Admission medications   Medication Sig Start Date End Date Taking? Authorizing Provider  enoxaparin (LOVENOX) 100 MG/ML injection Inject 90 mg into the skin daily.    [provider]  HYDROcodone-acetaminophen (NORCO/VICODIN) 5-325 MG tablet Take 1 tablet by mouth every 6 (six) hours as needed for moderate pain.    [provider]  metoprolol succinate (TOPROL-XL) 100 MG 24 hr tablet Take 50-100 mg by mouth daily as needed (palpitation).     [provider]  Multiple Vitamin (MULTIVITAMIN WITH MINERALS) TABS tablet Take 1 tablet by mouth daily.  [provider]  omeprazole (PRILOSEC) 20 MG capsule Take 1 capsule (20 mg total) by mouth daily. 02/04/20   Levin Erp, PA  pramipexole (MIRAPEX) 0.125 MG tablet Take 0.125 mg by mouth 3 (three) times daily.    [provider]  simvastatin (ZOCOR) 40 MG tablet Take 40 mg by mouth every evening. 02/07/18   [provider]  Vitamin D, Ergocalciferol, (DRISDOL) 1.25 MG (50000 UT) CAPS capsule Take 50,000 Units by mouth every Saturday. 06/29/15   [provider]  warfarin (COUMADIN) 3 MG tablet Take 1.5-3 mg by mouth See admin instructions. Take 1 tablet (3mg ) all days EXCEPT, Thursday take 1/2 tablet 1.5mg      [provider]    Allergies    Buspar [buspirone], Cymbalta [duloxetine hcl], Delsym [dextromethorphan], Docusate sodium, Gabapentin, Lyrica [pregabalin], Savella [milnacipran hcl], and Rivaroxaban  Review of Systems   Review of Systems  Constitutional: Negative for chills and fever.  HENT: Negative for congestion and sore throat.   Eyes: Negative for visual disturbance.  Respiratory: Negative for shortness of breath.   Cardiovascular: Negative for chest pain.  Gastrointestinal: Positive for abdominal pain and nausea. Negative for constipation, diarrhea and vomiting.  Genitourinary: Negative for enuresis and flank pain.  Musculoskeletal: Negative for back pain.  Skin: Negative for rash and wound.  Neurological: Negative for dizziness and headaches.  Hematological: Does not bruise/bleed easily.    Physical Exam Updated Vital Signs BP 122/83   Pulse 96   Temp 98.3 F (36.8 C) (Oral)   Resp 16   Ht 4\' 10"  (1.473 m)   Wt 62.6 kg   SpO2 100%   BMI 28.84 kg/m   Physical Exam Vitals and nursing note reviewed.  Constitutional:      General: She is not in acute distress.    Appearance: She is not ill-appearing.  HENT:     Head: Normocephalic and atraumatic.     Nose: No congestion.  Eyes:     Conjunctiva/sclera: Conjunctivae normal.  Cardiovascular:     Rate and Rhythm: Normal rate and regular rhythm.     Pulses: Normal pulses.     Heart sounds: No murmur heard. No friction rub. No gallop.   Pulmonary:     Effort: No respiratory distress.     Breath sounds: No wheezing, rhonchi or rales.  Abdominal:     General: There is no distension.     Palpations: Abdomen is soft.     Tenderness: There is abdominal tenderness. There is no right CVA tenderness or left CVA tenderness.     Comments: Abdomen is visualized it was nondistended, normoactive bowel sounds, dull to percussion, no noted hernia present.  Abdomen was palpated it was tender to palpation along her  umbilicus, there is no rebound tenderness, negative McBurney point, peritoneal sign, Murphy sign.  No CVA tenderness.   Musculoskeletal:     Right lower leg: No edema.     Left lower leg: No edema.  Skin:    General: Skin is warm and dry.  Neurological:     Mental Status: She is alert.  Psychiatric:        Mood and Affect: Mood normal.     ED Results / Procedures / Treatments   Labs (all labs ordered are listed, but only abnormal results are displayed) Labs Reviewed  CBC WITH DIFFERENTIAL/PLATELET - Abnormal; Notable for the following components:      Result Value   Abs Immature Granulocytes 0.09 (*)    All  other components within normal limits  COMPREHENSIVE METABOLIC PANEL  LIPASE, BLOOD    EKG None  Radiology CT Abdomen Pelvis W Contrast  Result Date: 06/19/2020 CLINICAL DATA:  Abdominal pain EXAM: CT ABDOMEN AND PELVIS WITH CONTRAST TECHNIQUE: Multidetector CT imaging of the abdomen and pelvis was performed using the standard protocol following bolus administration of intravenous contrast. CONTRAST:  162mL OMNIPAQUE IOHEXOL 300 MG/ML  SOLN COMPARISON:  12/16/2016 FINDINGS: Lower chest: No acute abnormality. Hepatobiliary: Diffuse low-density throughout the liver compatible with fatty infiltration. No focal abnormality. Gallbladder unremarkable. Pancreas: No focal abnormality or ductal dilatation. Spleen: No focal abnormality.  Normal size. Adrenals/Urinary Tract: No adrenal abnormality. No focal renal abnormality. No stones or hydronephrosis. Urinary bladder is unremarkable. Stomach/Bowel: Sigmoid diverticulosis. No active diverticulitis. Stomach and small bowel decompressed, unremarkable. Vascular/Lymphatic: Aortic atherosclerosis. No evidence of aneurysm or adenopathy. Reproductive: Prior hysterectomy.  No adnexal masses. Other: No free fluid or free air. Small umbilical hernia containing fat, stable since prior study. Musculoskeletal: No acute bony abnormality. IMPRESSION:  Hepatic steatosis. Small umbilical hernia containing fat. Aortic atherosclerosis. Electronically Signed   By: Rolm Baptise M.D.   On: 06/19/2020 20:40    Procedures Procedures   Medications Ordered in ED Medications  morphine 4 MG/ML injection 4 mg (4 mg Intravenous Given 06/19/20 1818)  ondansetron (ZOFRAN) injection 4 mg (4 mg Intravenous Given 06/19/20 1818)  sodium chloride 0.9 % bolus 500 mL (0 mLs Intravenous Stopped 06/19/20 1925)  morphine 4 MG/ML injection 4 mg (4 mg Intravenous Given 06/19/20 1923)  iohexol (OMNIPAQUE) 300 MG/ML solution 100 mL (100 mLs Intravenous Contrast Given 06/19/20 2021)    ED Course  I have reviewed the triage vital signs and the nursing notes.  Pertinent labs & imaging results that were available during my care of the patient were reviewed by me and considered in my medical decision making (see chart for details).    MDM Rules/Calculators/A&P                         Initial impression-patient presents with umbilicus pain she is alert, does not appear in acute distress, vital signs notable for tachycardia.  Will obtain basic abdominal lab work-up, provide patient with antiemetics, fluids, narcotics and reassess.  Work-up-CBC unremarkable, CMP unremarkable, lipase 32.  CT abdomen pelvis shows small umbilical hernia containing fat.   Reassessment patient reassessed after providing her with morphine, antiemetics, fluids, she continues to have periumbilical pain especially with palpation, will obtain CT abdomen pelvis for further evaluation.  Patient is reassessed has no complaints at this time, vital signs remained stable.  Patient is agreeable for discharge at this time.  Rule out-I have low suspicion for systemic infection as patient is nontoxic-appearing, vital signs reassuring, no obvious source infection on my exam.  Low suspicion for AAA as patient has no risk factors, no pulsating mass on my exam, no aneurysm seen on CT imaging.  Low suspicion for  pancreatitis as lipase is within normal limits.  low suspicion for gallbladder or liver abnormalities as there is no right upper quadrant pain, liver enzymes within normal limits.  Low suspicion for strangulated or incarcerated hernia as hernia is reducible.   Plan-suspect patient's pain is secondary due to small umbilical hernia containing fat.  Will recommend hernia belt for comfort, over-the-counter pain medications, follow-up with general surgery for further evaluation.  Vital signs have remained stable, no indication for hospital admission.  Patient given at home care as well strict  return precautions.  Patient verbalized that they understood agreed to said plan.   Final Clinical Impression(s) / ED Diagnoses Final diagnoses:  Periumbilical abdominal pain  Umbilical hernia without obstruction and without gangrene    Rx / DC Orders ED Discharge Orders    None       Aron Baba 06/19/20 2108    Dorie Rank, MD 06/20/20 1440

## 2020-06-19 NOTE — Discharge Instructions (Signed)
You have a periumbilical hernia which I suspect is causing your pain.  I would avoid trying to lift heavy objects, wearing a hernia belt, taking over-the-counter pain medications like ibuprofen and or Tylenol every 6 hours as needed please follow dosing on back of bottle.  I want you to follow-up with general surgery for further evaluation.  Come back to the emergency department if you develop chest pain, shortness of breath, severe abdominal pain, uncontrolled nausea, vomiting, diarrhea.

## 2020-06-19 NOTE — ED Triage Notes (Signed)
Patient states a few weeks ago she developed abdominal pain in umbilical area. Says pain felt like off and on bee stings. Patient says pain has began to radiate upward and can now feel lump under skin. Patient reports pain is 10/10. Patient states she has hiatal hernia and also has factor 2 blood disorder.

## 2020-08-01 ENCOUNTER — Other Ambulatory Visit: Payer: Self-pay | Admitting: Physician Assistant

## 2022-12-12 IMAGING — CT CT ABD-PELV W/ CM
3 of 5 series · 17 of 46 positions shown, 19 images · IV contrast (omnipaque)
Comparison: 12/16/2016

CLINICAL DATA: Abdominal pain

EXAM:
CT ABDOMEN AND PELVIS WITH CONTRAST
TECHNIQUE: Multidetector CT imaging of the abdomen and pelvis was performed
using the standard protocol following bolus administration of
intravenous contrast.
CONTRAST:  100mL OMNIPAQUE IOHEXOL 300 MG/ML  SOLN

[Series 2: axial st · axial · 0.76mm/px · z∈[-442,-112]mm · 12 of 80 slices shown, 14 images]
[im 7/80  soft-tissue]
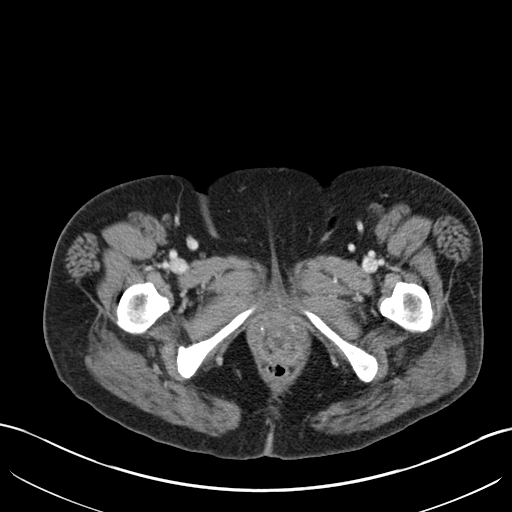
[im 7/80  bone]
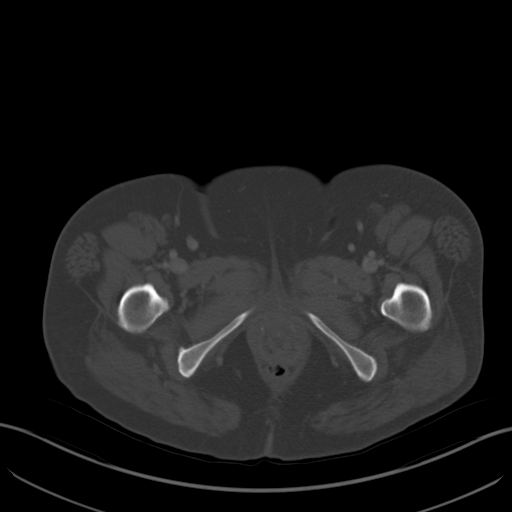
[im 13/80  soft-tissue]
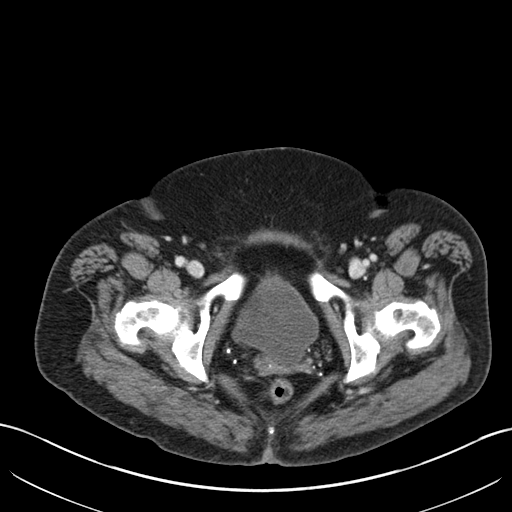
[im 19/80  soft-tissue]
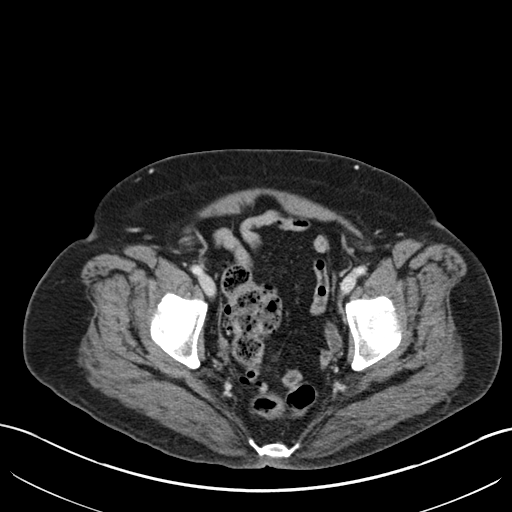
[im 25/80  soft-tissue]
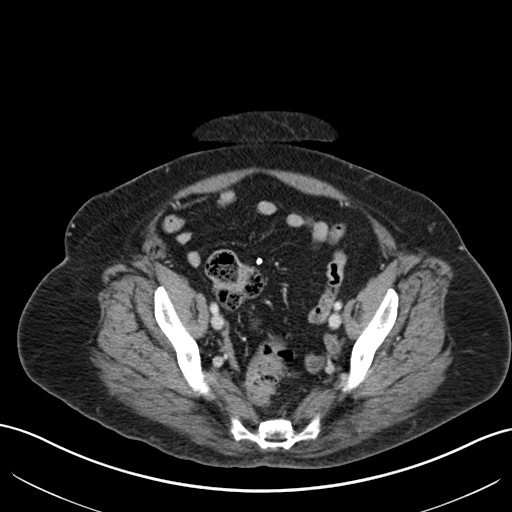
[im 31/80  soft-tissue]
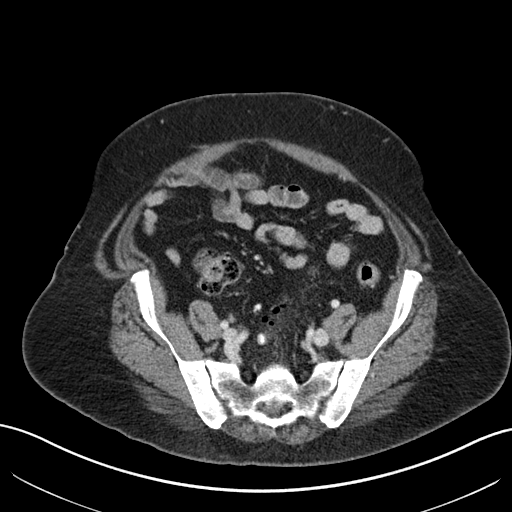
[im 37/80  soft-tissue]
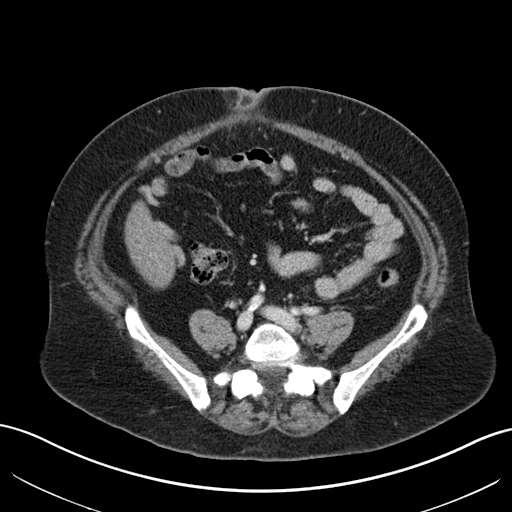
[im 43/80  soft-tissue]
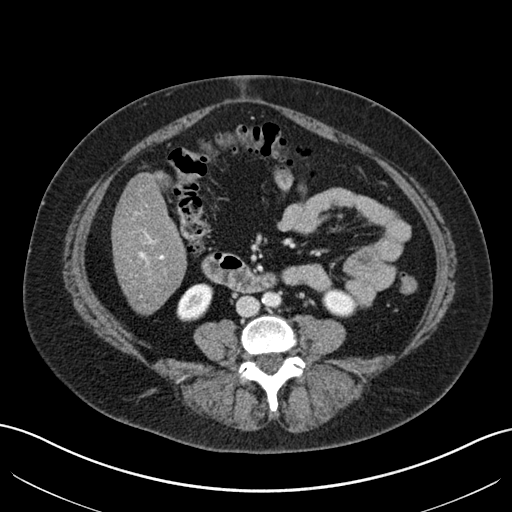
[im 49/80  soft-tissue]
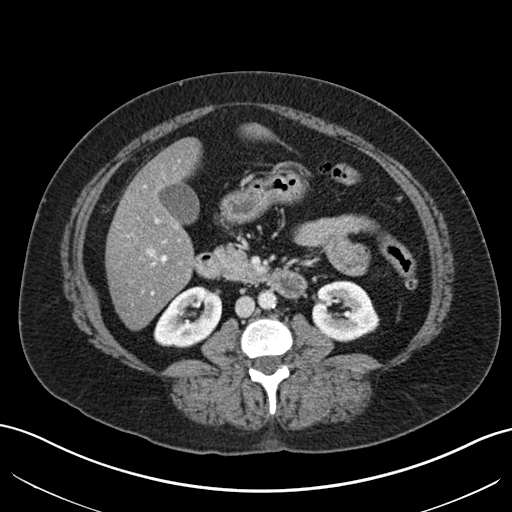
[im 55/80  soft-tissue]
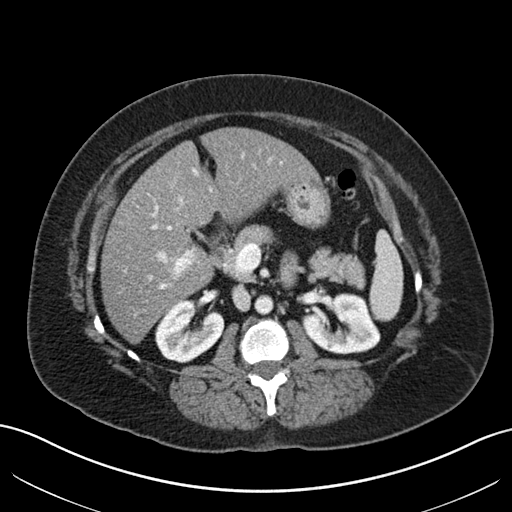
[im 55/80  bone]
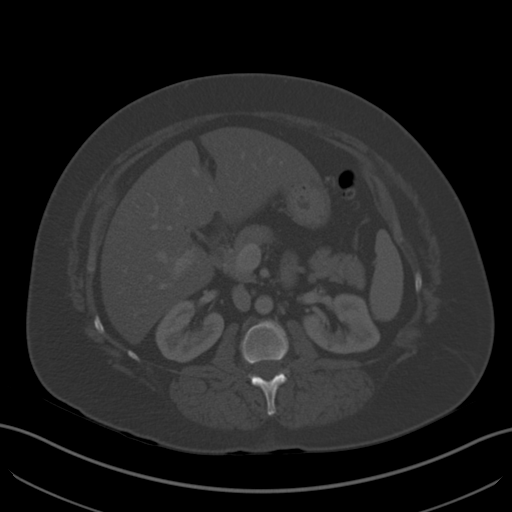
[im 61/80  soft-tissue]
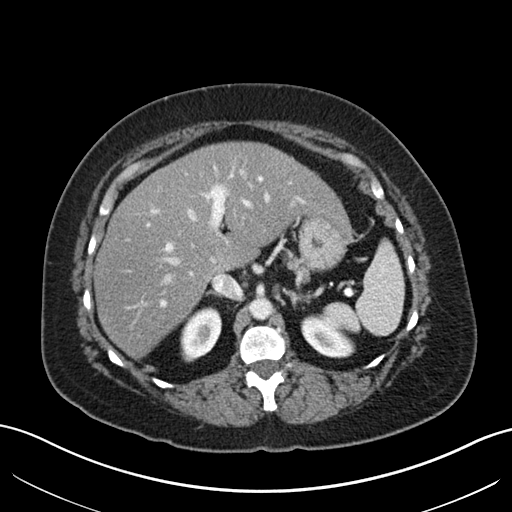
[im 67/80  soft-tissue]
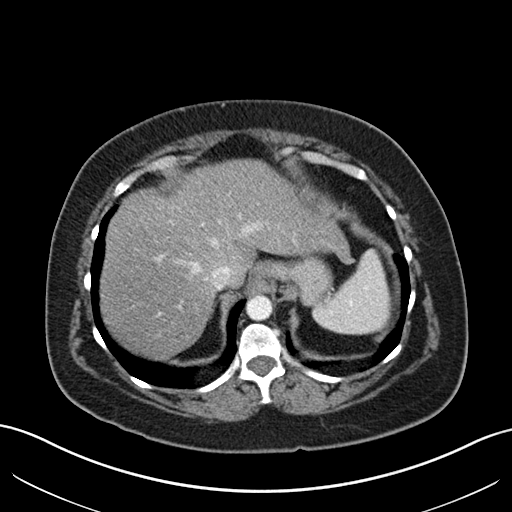
[im 73/80  soft-tissue]
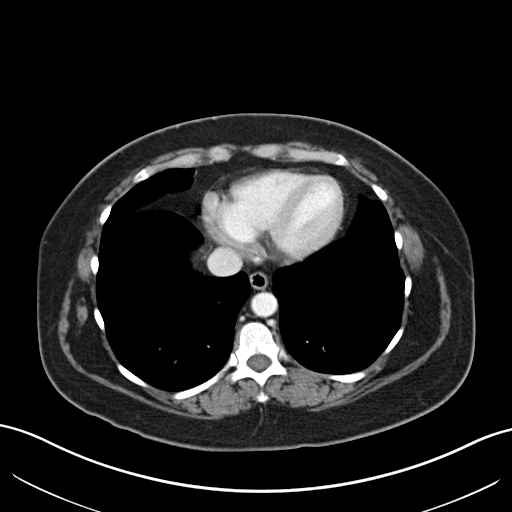

[Series 4: lung bases · axial · 0.76mm/px · z∈[-207,-183]mm · 2 of 71 slices shown]
[im 6/71  bone]
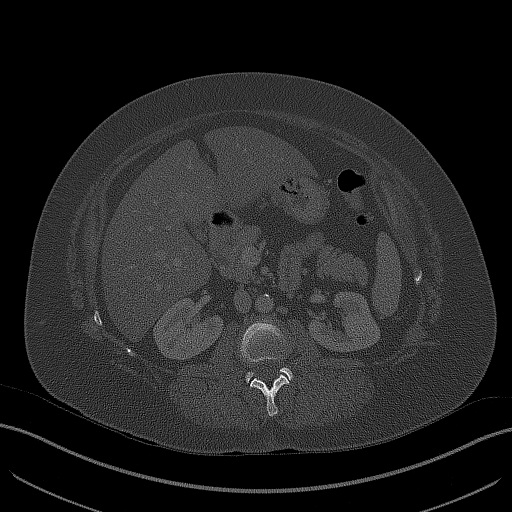
[im 18/71  bone]
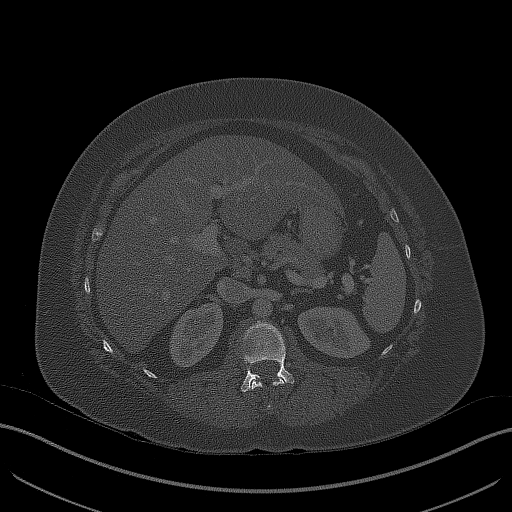

[Series 5: coronal st · coronal · 0.76mm/px · 3 of 155 slices shown]
[im 52/155  soft-tissue]
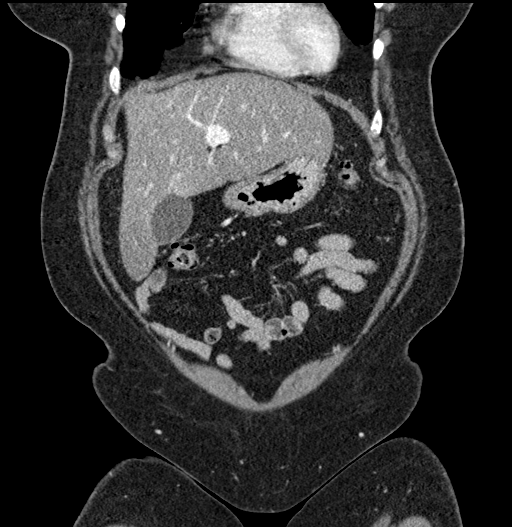
[im 69/155  soft-tissue]
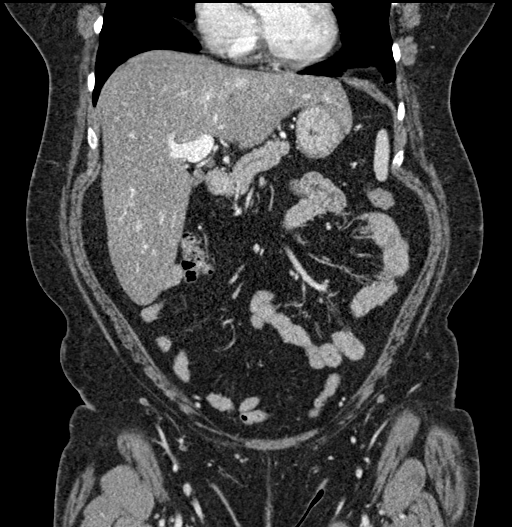
[im 86/155  soft-tissue]
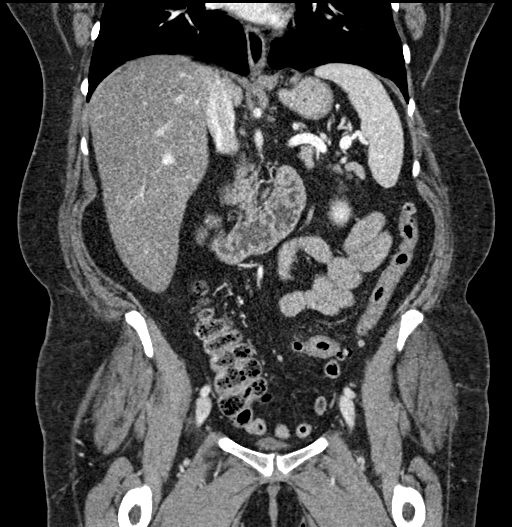

[17 of 46 positions shown; findings below may reference images not displayed]

FINDINGS: Lower chest: No acute abnormality.

Hepatobiliary: Diffuse low-density throughout the liver compatible
with fatty infiltration. No focal abnormality. Gallbladder
unremarkable.

Pancreas: No focal abnormality or ductal dilatation.

Spleen: No focal abnormality.  Normal size.

Adrenals/Urinary Tract: No adrenal abnormality. No focal renal
abnormality. No stones or hydronephrosis. Urinary bladder is
unremarkable.

Stomach/Bowel: Sigmoid diverticulosis. No active diverticulitis.
Stomach and small bowel decompressed, unremarkable.

Vascular/Lymphatic: Aortic atherosclerosis. No evidence of aneurysm
or adenopathy.

Reproductive: Prior hysterectomy.  No adnexal masses.

Other: No free fluid or free air. Small umbilical hernia containing
fat, stable since prior study.

Musculoskeletal: No acute bony abnormality.
IMPRESSION: Hepatic steatosis.

Small umbilical hernia containing fat.

Aortic atherosclerosis.

## 2023-02-25 ENCOUNTER — Encounter: Payer: Self-pay | Admitting: Gastroenterology
